# Patient Record
Sex: Male | Born: 1975 | Race: Black or African American | Hispanic: No | Smoking: Never smoker
Health system: Southern US, Community
[De-identification: ages and names within clinical notes are randomized; demographics above are authoritative.]

## PROBLEM LIST (undated history)

## (undated) ENCOUNTER — Emergency Department (HOSPITAL_COMMUNITY): Admission: EM | Payer: No Typology Code available for payment source | Source: Home / Self Care

## (undated) ENCOUNTER — Emergency Department (HOSPITAL_COMMUNITY): Discharge status: Absent without leave | Payer: No Typology Code available for payment source

## (undated) DIAGNOSIS — F329 Major depressive disorder, single episode, unspecified: Secondary | ICD-10-CM

## (undated) DIAGNOSIS — F32A Depression, unspecified: Secondary | ICD-10-CM

## (undated) DIAGNOSIS — N419 Inflammatory disease of prostate, unspecified: Secondary | ICD-10-CM

## (undated) HISTORY — DX: Inflammatory disease of prostate, unspecified: N41.9

## (undated) HISTORY — PX: APPENDECTOMY: SHX54

## (undated) HISTORY — DX: Depression, unspecified: F32.A

## (undated) HISTORY — DX: Major depressive disorder, single episode, unspecified: F32.9

---

## 2001-10-30 ENCOUNTER — Emergency Department (HOSPITAL_COMMUNITY): Admission: EM | Admit: 2001-10-30 | Discharge: 2001-10-30 | Payer: Self-pay | Admitting: *Deleted

## 2002-08-08 ENCOUNTER — Emergency Department (HOSPITAL_COMMUNITY): Admission: EM | Admit: 2002-08-08 | Discharge: 2002-08-08 | Payer: Self-pay | Admitting: Internal Medicine

## 2002-12-25 ENCOUNTER — Ambulatory Visit (HOSPITAL_COMMUNITY): Admission: RE | Admit: 2002-12-25 | Discharge: 2002-12-25 | Payer: Self-pay | Admitting: Internal Medicine

## 2002-12-25 ENCOUNTER — Encounter: Payer: Self-pay | Admitting: Internal Medicine

## 2003-06-02 ENCOUNTER — Emergency Department (HOSPITAL_COMMUNITY): Admission: EM | Admit: 2003-06-02 | Discharge: 2003-06-02 | Payer: Self-pay | Admitting: Emergency Medicine

## 2003-08-01 ENCOUNTER — Emergency Department (HOSPITAL_COMMUNITY): Admission: EM | Admit: 2003-08-01 | Discharge: 2003-08-01 | Payer: Self-pay | Admitting: Emergency Medicine

## 2004-06-09 ENCOUNTER — Emergency Department (HOSPITAL_COMMUNITY): Admission: EM | Admit: 2004-06-09 | Discharge: 2004-06-09 | Payer: Self-pay | Admitting: Emergency Medicine

## 2004-12-15 ENCOUNTER — Emergency Department (HOSPITAL_COMMUNITY): Admission: EM | Admit: 2004-12-15 | Discharge: 2004-12-15 | Payer: Self-pay | Admitting: Family Medicine

## 2006-03-20 ENCOUNTER — Emergency Department (HOSPITAL_COMMUNITY): Admission: EM | Admit: 2006-03-20 | Discharge: 2006-03-20 | Payer: Self-pay | Admitting: Emergency Medicine

## 2008-07-02 ENCOUNTER — Emergency Department (HOSPITAL_COMMUNITY): Admission: EM | Admit: 2008-07-02 | Discharge: 2008-07-03 | Payer: Self-pay | Admitting: Emergency Medicine

## 2008-10-19 ENCOUNTER — Emergency Department (HOSPITAL_COMMUNITY): Admission: EM | Admit: 2008-10-19 | Discharge: 2008-10-19 | Payer: Self-pay | Admitting: Emergency Medicine

## 2009-10-23 ENCOUNTER — Encounter: Admission: RE | Admit: 2009-10-23 | Discharge: 2009-10-23 | Payer: Self-pay | Admitting: Chiropractic Medicine

## 2010-01-19 ENCOUNTER — Encounter (INDEPENDENT_AMBULATORY_CARE_PROVIDER_SITE_OTHER): Payer: Self-pay | Admitting: General Surgery

## 2010-01-19 ENCOUNTER — Inpatient Hospital Stay (HOSPITAL_COMMUNITY): Admission: EM | Admit: 2010-01-19 | Discharge: 2010-01-21 | Payer: Self-pay | Admitting: Emergency Medicine

## 2010-07-31 ENCOUNTER — Encounter (HOSPITAL_COMMUNITY): Admission: RE | Admit: 2010-07-31 | Discharge: 2010-08-21 | Payer: Self-pay | Admitting: Chiropractic Medicine

## 2010-08-26 ENCOUNTER — Encounter (HOSPITAL_COMMUNITY)
Admission: RE | Admit: 2010-08-26 | Discharge: 2010-09-25 | Payer: Self-pay | Source: Home / Self Care | Admitting: Chiropractic Medicine

## 2011-02-10 LAB — COMPREHENSIVE METABOLIC PANEL
ALT: 28 U/L (ref 0–53)
AST: 33 U/L (ref 0–37)
Albumin: 4.3 g/dL (ref 3.5–5.2)
Alkaline Phosphatase: 54 U/L (ref 39–117)
BUN: 8 mg/dL (ref 6–23)
CO2: 25 mEq/L (ref 19–32)
Calcium: 9.5 mg/dL (ref 8.4–10.5)
Chloride: 104 mEq/L (ref 96–112)
Creatinine, Ser: 1.2 mg/dL (ref 0.4–1.5)
GFR calc Af Amer: >60 mL/min (ref >60)
GFR calc non Af Amer: >60 mL/min (ref >60)
Glucose, Bld: 96 mg/dL (ref 70–99)
Potassium: 4.3 mEq/L (ref 3.5–5.1)
Sodium: 138 mEq/L (ref 135–145)
Total Bilirubin: 0.9 mg/dL (ref 0.3–1.2)
Total Protein: 7.7 g/dL (ref 6.0–8.3)

## 2011-02-10 LAB — CBC
HCT: 52.7 % — ABNORMAL HIGH (ref 39.0–52.0)
Hemoglobin: 16.6 g/dL (ref 13.0–17.0)
MCHC: 31.5 g/dL (ref 30.0–36.0)
MCV: 80.2 fL (ref 78.0–100.0)
Platelets: 274 10*3/uL (ref 150–400)
RBC: 6.57 MIL/uL — ABNORMAL HIGH (ref 4.22–5.81)
RDW: 12.4 % (ref 11.5–15.5)
WBC: 10.6 10*3/uL — ABNORMAL HIGH (ref 4.0–10.5)

## 2011-02-10 LAB — DIFFERENTIAL
Basophils Relative: 1 % (ref 0–1)
Eosinophils Absolute: 0.1 10*3/uL (ref 0.0–0.7)
Eosinophils Relative: 1 % (ref 0–5)
Lymphs Abs: 1.5 10*3/uL (ref 0.7–4.0)
Monocytes Relative: 10 % (ref 3–12)

## 2011-02-10 LAB — URINALYSIS, ROUTINE W REFLEX MICROSCOPIC
Bilirubin Urine: NEGATIVE
Glucose, UA: NEGATIVE mg/dL
Ketones, ur: NEGATIVE mg/dL
Protein, ur: NEGATIVE mg/dL
pH: 6.5 (ref 5.0–8.0)

## 2011-02-10 LAB — LIPASE, BLOOD: Lipase: 22 U/L (ref 11–59)

## 2011-02-14 LAB — CBC
HCT: 41.2 % (ref 39.0–52.0)
MCHC: 33 g/dL (ref 30.0–36.0)
MCV: 87.5 fL (ref 78.0–100.0)
Platelets: 231 10*3/uL (ref 150–400)
RDW: 12.5 % (ref 11.5–15.5)

## 2011-02-14 LAB — BASIC METABOLIC PANEL
BUN: 5 mg/dL — ABNORMAL LOW (ref 6–23)
Chloride: 107 mEq/L (ref 96–112)
Glucose, Bld: 94 mg/dL (ref 70–99)
Potassium: 4.2 mEq/L (ref 3.5–5.1)

## 2011-06-30 ENCOUNTER — Emergency Department (HOSPITAL_COMMUNITY)
Admission: EM | Admit: 2011-06-30 | Discharge: 2011-06-30 | Disposition: A | Discharge status: Home / Self Care | Payer: Self-pay | Attending: Emergency Medicine | Admitting: Emergency Medicine

## 2011-06-30 ENCOUNTER — Emergency Department (HOSPITAL_COMMUNITY): Payer: Self-pay

## 2011-06-30 DIAGNOSIS — M546 Pain in thoracic spine: Secondary | ICD-10-CM | POA: Insufficient documentation

## 2011-06-30 DIAGNOSIS — R079 Chest pain, unspecified: Secondary | ICD-10-CM | POA: Insufficient documentation

## 2011-06-30 DIAGNOSIS — M25519 Pain in unspecified shoulder: Secondary | ICD-10-CM | POA: Insufficient documentation

## 2011-06-30 LAB — DIFFERENTIAL
Basophils Absolute: 0 10*3/uL (ref 0.0–0.1)
Basophils Relative: 1 % (ref 0–1)
Eosinophils Absolute: 0.1 10*3/uL (ref 0.0–0.7)
Eosinophils Relative: 2 % (ref 0–5)
Lymphs Abs: 2.5 10*3/uL (ref 0.7–4.0)

## 2011-06-30 LAB — BASIC METABOLIC PANEL
Calcium: 9.5 mg/dL (ref 8.4–10.5)
GFR calc non Af Amer: 46 mL/min — ABNORMAL LOW (ref >60)
Glucose, Bld: 88 mg/dL (ref 70–99)
Sodium: 139 mEq/L (ref 135–145)

## 2011-06-30 LAB — CBC
MCV: 80 fL (ref 78.0–100.0)
Platelets: 265 10*3/uL (ref 150–400)
RDW: 12.7 % (ref 11.5–15.5)
WBC: 6 10*3/uL (ref 4.0–10.5)

## 2011-08-20 LAB — URINALYSIS, ROUTINE W REFLEX MICROSCOPIC
Bilirubin Urine: NEGATIVE
Hgb urine dipstick: NEGATIVE
Protein, ur: NEGATIVE
Urobilinogen, UA: 0.2

## 2011-09-03 ENCOUNTER — Other Ambulatory Visit: Payer: Self-pay | Admitting: Chiropractic Medicine

## 2011-09-03 DIAGNOSIS — M25512 Pain in left shoulder: Secondary | ICD-10-CM

## 2011-09-07 ENCOUNTER — Other Ambulatory Visit: Payer: Self-pay

## 2012-03-02 ENCOUNTER — Encounter: Payer: Self-pay | Admitting: Orthopedic Surgery

## 2012-03-02 ENCOUNTER — Ambulatory Visit (INDEPENDENT_AMBULATORY_CARE_PROVIDER_SITE_OTHER): Payer: Self-pay | Admitting: Orthopedic Surgery

## 2012-03-02 VITALS — Ht 68.0 in | Wt 165.0 lb

## 2012-03-02 DIAGNOSIS — M779 Enthesopathy, unspecified: Secondary | ICD-10-CM

## 2012-03-02 DIAGNOSIS — S93409A Sprain of unspecified ligament of unspecified ankle, initial encounter: Secondary | ICD-10-CM

## 2012-03-02 MED ORDER — PREDNISONE 5 MG PO KIT: 5.0000 mg | PACK | ORAL | Status: DC

## 2012-03-02 NOTE — Patient Instructions (Addendum)
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

## 2012-03-02 NOTE — Progress Notes (Signed)
Patient ID: Steven Lloyd, male   DOB: 1976/06/24, 36 y.o.   MRN: [id number] Chief Complaint  Patient presents with  . Ankle Injury    Left Ankle injury.     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 Before he could continue fighting.  He complains of no ankle pain. At this time is unable to train without any difficulty.  He is complaining of some discomfort in his back and in his LEFT knee and would like some anti-inflammatories, trauma, and ibuprofen for that.  On examination, he is well-developed nourished, male, who remained hygiene or intact.  Vital signs are stable.  His ankle is completely stable. He performed a hop test without difficulty. He had no tenderness or swelling. No muscle weakness. His neurovascular exam was intact.  Impression ankle sprain, resolved.  Prednisone Dosepak for inflammation.  He is completely cleared for professional fight

## 2012-11-27 ENCOUNTER — Ambulatory Visit: Payer: Self-pay

## 2012-11-27 ENCOUNTER — Encounter: Payer: Self-pay | Admitting: Orthopedic Surgery

## 2012-11-27 ENCOUNTER — Ambulatory Visit (INDEPENDENT_AMBULATORY_CARE_PROVIDER_SITE_OTHER): Payer: Self-pay | Admitting: Orthopedic Surgery

## 2012-11-27 VITALS — BP 102/70 | Ht 68.0 in | Wt 169.8 lb

## 2012-11-27 DIAGNOSIS — M7550 Bursitis of unspecified shoulder: Secondary | ICD-10-CM | POA: Insufficient documentation

## 2012-11-27 DIAGNOSIS — M719 Bursopathy, unspecified: Secondary | ICD-10-CM

## 2012-11-27 DIAGNOSIS — M25519 Pain in unspecified shoulder: Secondary | ICD-10-CM

## 2012-11-27 DIAGNOSIS — M67919 Unspecified disorder of synovium and tendon, unspecified shoulder: Secondary | ICD-10-CM

## 2012-11-27 NOTE — Progress Notes (Signed)
Patient ID: Steven Lloyd, male   DOB: November 11, 1976, 37 y.o.   MRN: [id number] Chief Complaint  Patient presents with  . Shoulder Pain     bilateral shoulder pain started 3 weeks ago knot on right shoulder    Patient presents with recurrent bilateral shoulder pain  He is a [employer] examiner  He complains of bilateral shoulder pain when he loses form while boxing  He has mild painful for elevation he has a prominence over his right distal clavicle  Is tenderness over the distal clavicle at the a.c. joint on the right none on the left is bilateral impingement syndrome with strong intact rotator cuff  Recommend injection bilateral  Subacromial Shoulder Injection Procedure Note  Pre-operative Diagnosis: left RC Syndrome  Post-operative Diagnosis: same  Indications: pain   Anesthesia: ethyl chloride   Procedure Details   Verbal consent was obtained for the procedure. The shoulder was prepped withalcohol and the skin was anesthetized. A 20 gauge needle was advanced into the subacromial space through posterior approach without difficulty  The space was then injected with 3 ml 1% lidocaine and 1 ml of depomedrol. The injection site was cleansed with isopropyl alcohol and a dressing was applied.  Complications:  None; patient tolerated the procedure well.  Shoulder Injection Procedure Note   Pre-operative Diagnosis: right  RC Syndrome  Post-operative Diagnosis: same  Indications: pain   Anesthesia: ethyl chloride   Procedure Details   Verbal consent was obtained for the procedure. The shoulder was prepped withalcohol and the skin was anesthetized. A 20 gauge needle was advanced into the subacromial space through posterior approach without difficulty  The space was then injected with 3 ml 1% lidocaine and 1 ml of depomedrol. The injection site was cleansed with isopropyl alcohol and a dressing was applied.  Complications:  None; patient tolerated the procedure well.

## 2012-11-27 NOTE — Patient Instructions (Addendum)
You have received a steroid shot. 15% of patients experience increased pain at the injection site with in the next 24 hours. This is best treated with ice and tylenol extra strength 2 tabs every 8 hours. If you are still having pain please call the office.   Rotator Cuff Tendonitis   The rotator cuff is the collection of all the muscles and tendons (the supraspinatus, infraspinatus, subscapularis, and teres minor muscles and their tendons) that help your shoulder stay in place. This unit holds the head of the upper arm bone (humerus) in the cup (fossa) of the shoulder blade (scapula). Basically, it connects the arm to the shoulder. Tendinitis is a swelling and irritation of the tissue, called cord like structures (tendons) that connect muscle to bone. It usually is caused by overusing the joint involved. When the tissue surrounding a tendon (the synovium) becomes inflamed, it is called tenosynovitis. This also is often the result of overuse in people whose jobs require repetitive (over and over again) types of motion. HOME CARE INSTRUCTIONS    Use a sling or splint for as long as directed by your caregiver until the pain decreases.   Apply ice to the injury for 15 to 20 minutes, 3 to 4 times per day. Put the ice in a plastic bag and place a towel between the bag of ice and your skin.   Try to avoid use other than gentle range of motion while your shoulder is painful. Use and exercise only as directed by your caregiver. Stop exercises or range of motion if pain or discomfort increases, unless directed otherwise by your caregiver.   Only take over-the-counter or prescription medicines for pain, discomfort, or fever as directed by your caregiver.   If you were give a shoulder sling and straps (immobilizer), do not remove it except as directed, or until you see a caregiver for a follow-up examination. If you need to remove it, move your arm as little as possible or as directed.   You may want to sleep on  several pillows at night to lessen swelling and pain.  SEEK IMMEDIATE MEDICAL CARE IF:    Pain in your shoulder increases or new pain develops in your arm, hand, or fingers and is not relieved with medications.   You develop new, unexplained symptoms, especially increased numbness in the hands or loss of strength, or you develop any worsening of the problems which brought you in for care.   Your arm, hand, or fingers are numb or tingling.   Your arm, hand, or fingers are swollen, painful, or turn white or blue.  Document Released: 01/29/2004 Document Revised: 01/31/2012 Document Reviewed: 09/05/2008 [facility] Patient Information 2013 [place], [place].   Impingement Syndrome, Rotator Cuff, Bursitis with Rehab Impingement syndrome is a condition that involves inflammation of the tendons of the rotator cuff and the subacromial bursa, that causes pain in the shoulder. The rotator cuff consists of four tendons and muscles that control much of the shoulder and upper arm function. The subacromial bursa is a fluid filled sac that helps reduce friction between the rotator cuff and one of the bones of the shoulder (acromion). Impingement syndrome is usually an overuse injury that causes swelling of the bursa (bursitis), swelling of the tendon (tendonitis), and/or a tear of the tendon (strain). Strains are classified into three categories. Grade 1 strains cause pain, but the tendon is not lengthened. Grade 2 strains include a lengthened ligament, due to the ligament being stretched or partially ruptured. With  grade 2 strains there is still function, although the function may be decreased. Grade 3 strains include a complete tear of the tendon or muscle, and function is usually impaired. SYMPTOMS    Pain around the shoulder, often at the outer portion of the upper arm.   Pain that gets worse with shoulder function, especially when reaching overhead or lifting.   Sometimes, aching when not using the arm.    Pain that wakes you up at night.   Sometimes, tenderness, swelling, warmth, or redness over the affected area.   Loss of strength.   Limited motion of the shoulder, especially reaching behind the back (to the back pocket or to unhook bra) or across your body.   Crackling sound (crepitation) when moving the arm.   Biceps tendon pain and inflammation (in the front of the shoulder). Worse when bending the elbow or lifting.  CAUSES   Impingement syndrome is often an overuse injury, in which chronic (repetitive) motions cause the tendons or bursa to become inflamed. A strain occurs when a force is paced on the tendon or muscle that is greater than it can withstand. Common mechanisms of injury include: Stress from sudden increase in duration, frequency, or intensity of training.  Direct hit (trauma) to the shoulder.   Aging, erosion of the tendon with normal use.   Bony bump on shoulder (acromial spur).  RISK INCREASES WITH:  Contact sports (football, wrestling, boxing).   Throwing sports (baseball, tennis, volleyball).   Weightlifting and bodybuilding.   Heavy labor.   Previous injury to the rotator cuff, including impingement.   Poor shoulder strength and flexibility.   Failure to warm up properly before activity.   Inadequate protective equipment.   Old age.   Bony bump on shoulder (acromial spur).  PREVENTION    Warm up and stretch properly before activity.   Allow for adequate recovery between workouts.   Maintain physical fitness:   Strength, flexibility, and endurance.   Cardiovascular fitness.   Learn and use proper exercise technique.  PROGNOSIS   If treated properly, impingement syndrome usually goes away within 6 weeks. Sometimes surgery is required.   RELATED COMPLICATIONS    Longer healing time if not properly treated, or if not given enough time to heal.   Recurring symptoms, that result in a chronic condition.   Shoulder stiffness, frozen  shoulder, or loss of motion.   Rotator cuff tendon tear.   Recurring symptoms, especially if activity is resumed too soon, with overuse, with a direct blow, or when using poor technique.  TREATMENT   Treatment first involves the use of ice and medicine, to reduce pain and inflammation. The use of strengthening and stretching exercises may help reduce pain with activity. These exercises may be performed at home or with a therapist. If non-surgical treatment is unsuccessful after more than 6 months, surgery may be advised. After surgery and rehabilitation, activity is usually possible in 3 months.   MEDICATION  If pain medicine is needed, nonsteroidal anti-inflammatory medicines (aspirin and ibuprofen), or other minor pain relievers (acetaminophen), are often advised.   Do not take pain medicine for 7 days before surgery.   Prescription pain relievers may be given, if your caregiver thinks they are needed. Use only as directed and only as much as you need.   Corticosteroid injections may be given by your caregiver. These injections should be reserved for the most serious cases, because they may only be given a certain number of times.  HEAT  AND COLD  Cold treatment (icing) should be applied for 10 to 15 minutes every 2 to 3 hours for inflammation and pain, and immediately after activity that aggravates your symptoms. Use ice packs or an ice massage.   Heat treatment may be used before performing stretching and strengthening activities prescribed by your caregiver, physical therapist, or athletic trainer. Use a heat pack or a warm water soak.  SEEK MEDICAL CARE IF:    Symptoms get worse or do not improve in 4 to 6 weeks, despite treatment.   New, unexplained symptoms develop. (Drugs used in treatment may produce side effects.)  EXERCISES   RANGE OF MOTION (ROM) AND STRETCHING EXERCISES - Impingement Syndrome (Rotator Cuff  Tendinitis, Bursitis) These exercises may help you when beginning to  rehabilitate your injury. Your symptoms may go away with or without further involvement from your physician, physical therapist or athletic trainer. While completing these exercises, remember:    Restoring tissue flexibility helps normal motion to return to the joints. This allows healthier, less painful movement and activity.   An effective stretch should be held for at least 30 seconds.   A stretch should never be painful. You should only feel a gentle lengthening or release in the stretched tissue.  STRETCH  Flexion, Standing  Stand with good posture. With an underhand grip on your right / left hand, and an overhand grip on the opposite hand, grasp a broomstick or cane so that your hands are a little more than shoulder width apart.   Keeping your right / left elbow straight and shoulder muscles relaxed, push the stick with your opposite hand, to raise your right / left arm in front of your body and then overhead. Raise your arm until you feel a stretch in your right / left shoulder, but before you have increased shoulder pain.   Try to avoid shrugging your right / left shoulder as your arm rises, by keeping your shoulder blade tucked down and toward your mid-back spine. Hold for __________ seconds.   Slowly return to the starting position.  Repeat __________ times. Complete this exercise __________ times per day. STRETCH  Abduction, Supine  Lie on your back. With an underhand grip on your right / left hand and an overhand grip on the opposite hand, grasp a broomstick or cane so that your hands are a little more than shoulder width apart.   Keeping your right / left elbow straight and your shoulder muscles relaxed, push the stick with your opposite hand, to raise your right / left arm out to the side of your body and then overhead. Raise your arm until you feel a stretch in your right / left shoulder, but before you have increased shoulder pain.   Try to avoid shrugging your right / left  shoulder as your arm rises, by keeping your shoulder blade tucked down and toward your mid-back spine. Hold for __________ seconds.   Slowly return to the starting position.  Repeat __________ times. Complete this exercise __________ times per day. ROM  Flexion, Active-Assisted  Lie on your back. You may bend your knees for comfort.   Grasp a broomstick or cane so your hands are about shoulder width apart. Your right / left hand should grip the end of the stick, so that your hand is positioned "thumbs-up," as if you were about to shake hands.   Using your healthy arm to lead, raise your right / left arm overhead, until you feel a gentle stretch in  your shoulder. Hold for __________ seconds.   Use the stick to assist in returning your right / left arm to its starting position.  Repeat __________ times. Complete this exercise __________ times per day.   ROM - Internal Rotation, Supine   Lie on your back on a firm surface. Place your right / left elbow about 60 degrees away from your side. Elevate your elbow with a folded towel, so that the elbow and shoulder are the same height.   Using a broomstick or cane and your strong arm, pull your right / left hand toward your body until you feel a gentle stretch, but no increase in your shoulder pain. Keep your shoulder and elbow in place throughout the exercise.   Hold for __________ seconds. Slowly return to the starting position.  Repeat __________ times. Complete this exercise __________ times per day. STRETCH - Internal Rotation  Place your right / left hand behind your back, palm up.   Throw a towel or belt over your opposite shoulder. Grasp the towel with your right / left hand.   While keeping an upright posture, gently pull up on the towel, until you feel a stretch in the front of your right / left shoulder.   Avoid shrugging your right / left shoulder as your arm rises, by keeping your shoulder blade tucked down and toward your mid-back  spine.   Hold for __________ seconds. Release the stretch, by lowering your healthy hand.  Repeat __________ times. Complete this exercise __________ times per day. ROM - Internal Rotation   Using an underhand grip, grasp a stick behind your back with both hands.   While standing upright with good posture, slide the stick up your back until you feel a mild stretch in the front of your shoulder.   Hold for __________ seconds. Slowly return to your starting position.  Repeat __________ times. Complete this exercise __________ times per day.   STRETCH  Posterior Shoulder Capsule   Stand or sit with good posture. Grasp your right / left elbow and draw it across your chest, keeping it at the same height as your shoulder.   Pull your elbow, so your upper arm comes in closer to your chest. Pull until you feel a gentle stretch in the back of your shoulder.   Hold for __________ seconds.  Repeat __________ times. Complete this exercise __________ times per day. STRENGTHENING EXERCISES - Impingement Syndrome (Rotator Cuff Tendinitis, Bursitis) These exercises may help you when beginning to rehabilitate your injury. They may resolve your symptoms with or without further involvement from your physician, physical therapist or athletic trainer. While completing these exercises, remember:  Muscles can gain both the endurance and the strength needed for everyday activities through controlled exercises.   Complete these exercises as instructed by your physician, physical therapist or athletic trainer. Increase the resistance and repetitions only as guided.   You may experience muscle soreness or fatigue, but the pain or discomfort you are trying to eliminate should never worsen during these exercises. If this pain does get worse, stop and make sure you are following the directions exactly. If the pain is still present after adjustments, discontinue the exercise until you can discuss the trouble with your  clinician.   During your recovery, avoid activity or exercises which involve actions that place your injured hand or elbow above your head or behind your back or head. These positions stress the tissues which you are trying to heal.  STRENGTH - Scapular Depression  and Adduction   With good posture, sit on a firm chair. Support your arms in front of you, with pillows, arm rests, or on a table top. Have your elbows in line with the sides of your body.   Gently draw your shoulder blades down and toward your mid-back spine. Gradually increase the tension, without tensing the muscles along the top of your shoulders and the back of your neck.   Hold for __________ seconds. Slowly release the tension and relax your muscles completely before starting the next repetition.   After you have practiced this exercise, remove the arm support and complete the exercise in standing as well as sitting position.  Repeat __________ times. Complete this exercise __________ times per day.   STRENGTH - Shoulder Abductors, Isometric  With good posture, stand or sit about 4-6 inches from a wall, with your right / left side facing the wall.   Bend your right / left elbow. Gently press your right / left elbow into the wall. Increase the pressure gradually, until you are pressing as hard as you can, without shrugging your shoulder or increasing any shoulder discomfort.   Hold for __________ seconds.   Release the tension slowly. Relax your shoulder muscles completely before you begin the next repetition.  Repeat __________ times. Complete this exercise __________ times per day.   STRENGTH - External Rotators, Isometric  Keep your right / left elbow at your side and bend it 90 degrees.   Step into a door frame so that the outside of your right / left wrist can press against the door frame without your upper arm leaving your side.   Gently press your right / left wrist into the door frame, as if you were trying to  swing the back of your hand away from your stomach. Gradually increase the tension, until you are pressing as hard as you can, without shrugging your shoulder or increasing any shoulder discomfort.   Hold for __________ seconds.   Release the tension slowly. Relax your shoulder muscles completely before you begin the next repetition.  Repeat __________ times. Complete this exercise __________ times per day.   STRENGTH - Supraspinatus   Stand or sit with good posture. Grasp a __________ weight, or an exercise band or tubing, so that your hand is "thumbs-up," like you are shaking hands.   Slowly lift your right / left arm in a "V" away from your thigh, diagonally into the space between your side and straight ahead. Lift your hand to shoulder height or as far as you can, without increasing any shoulder pain. At first, many people do not lift their hands above shoulder height.   Avoid shrugging your right / left shoulder as your arm rises, by keeping your shoulder blade tucked down and toward your mid-back spine.   Hold for __________ seconds. Control the descent of your hand, as you slowly return to your starting position.  Repeat __________ times. Complete this exercise __________ times per day.   STRENGTH - External Rotators  Secure a rubber exercise band or tubing to a fixed object (table, pole) so that it is at the same height as your right / left elbow when you are standing or sitting on a firm surface.   Stand or sit so that the secured exercise band is at your uninjured side.   Bend your right / left elbow 90 degrees. Place a folded towel or small pillow under your right / left arm, so that your elbow is  a few inches away from your side.   Keeping the tension on the exercise band, pull it away from your body, as if pivoting on your elbow. Be sure to keep your body steady, so that the movement is coming only from your rotating shoulder.   Hold for __________ seconds. Release the tension  in a controlled manner, as you return to the starting position.  Repeat __________ times. Complete this exercise __________ times per day.   STRENGTH - Internal Rotators   Secure a rubber exercise band or tubing to a fixed object (table, pole) so that it is at the same height as your right / left elbow when you are standing or sitting on a firm surface.   Stand or sit so that the secured exercise band is at your right / left side.   Bend your elbow 90 degrees. Place a folded towel or small pillow under your right / left arm so that your elbow is a few inches away from your side.   Keeping the tension on the exercise band, pull it across your body, toward your stomach. Be sure to keep your body steady, so that the movement is coming only from your rotating shoulder.   Hold for __________ seconds. Release the tension in a controlled manner, as you return to the starting position.  Repeat __________ times. Complete this exercise __________ times per day.   STRENGTH - Scapular Protractors, Standing   Stand arms length away from a wall. Place your hands on the wall, keeping your elbows straight.   Begin by dropping your shoulder blades down and toward your mid-back spine.   To strengthen your protractors, keep your shoulder blades down, but slide them forward on your rib cage. It will feel as if you are lifting the back of your rib cage away from the wall. This is a subtle motion and can be challenging to complete. Ask your caregiver for further instruction, if you are not sure you are doing the exercise correctly.   Hold for __________ seconds. Slowly return to the starting position, resting the muscles completely before starting the next repetition.  Repeat __________ times. Complete this exercise __________ times per day. STRENGTH - Scapular Protractors, Supine  Lie on your back on a firm surface. Extend your right / left arm straight into the air while holding a __________ weight in your  hand.   Keeping your head and back in place, lift your shoulder off the floor.   Hold for __________ seconds. Slowly return to the starting position, and allow your muscles to relax completely before starting the next repetition.  Repeat __________ times. Complete this exercise __________ times per day. STRENGTH - Scapular Protractors, Quadruped  Get onto your hands and knees, with your shoulders directly over your hands (or as close as you can be, comfortably).   Keeping your elbows locked, lift the back of your rib cage up into your shoulder blades, so your mid-back rounds out. Keep your neck muscles relaxed.   Hold this position for __________ seconds. Slowly return to the starting position and allow your muscles to relax completely before starting the next repetition.  Repeat __________ times. Complete this exercise __________ times per day.   STRENGTH - Scapular Retractors  Secure a rubber exercise band or tubing to a fixed object (table, pole), so that it is at the height of your shoulders when you are either standing, or sitting on a firm armless chair.   With a palm down grip,  grasp an end of the band in each hand. Straighten your elbows and lift your hands straight in front of you, at shoulder height. Step back, away from the secured end of the band, until it becomes tense.   Squeezing your shoulder blades together, draw your elbows back toward your sides, as you bend them. Keep your upper arms lifted away from your body throughout the exercise.   Hold for __________ seconds. Slowly ease the tension on the band, as you reverse the directions and return to the starting position.  Repeat __________ times. Complete this exercise __________ times per day. STRENGTH - Shoulder Extensors   Secure a rubber exercise band or tubing to a fixed object (table, pole) so that it is at the height of your shoulders when you are either standing, or sitting on a firm armless chair.   With a  thumbs-up grip, grasp an end of the band in each hand. Straighten your elbows and lift your hands straight in front of you, at shoulder height. Step back, away from the secured end of the band, until it becomes tense.   Squeezing your shoulder blades together, pull your hands down to the sides of your thighs. Do not allow your hands to go behind you.   Hold for __________ seconds. Slowly ease the tension on the band, as you reverse the directions and return to the starting position.  Repeat __________ times. Complete this exercise __________ times per day.   STRENGTH - Scapular Retractors and External Rotators   Secure a rubber exercise band or tubing to a fixed object (table, pole) so that it is at the height as your shoulders, when you are either standing, or sitting on a firm armless chair.   With a palm down grip, grasp an end of the band in each hand. Bend your elbows 90 degrees and lift your elbows to shoulder height, at your sides. Step back, away from the secured end of the band, until it becomes tense.   Squeezing your shoulder blades together, rotate your shoulders so that your upper arms and elbows remain stationary, but your fists travel upward to head height.   Hold for __________ seconds. Slowly ease the tension on the band, as you reverse the directions and return to the starting position.  Repeat __________ times. Complete this exercise __________ times per day.   STRENGTH - Scapular Retractors and External Rotators, Rowing   Secure a rubber exercise band or tubing to a fixed object (table, pole) so that it is at the height of your shoulders, when you are either standing, or sitting on a firm armless chair.   With a palm down grip, grasp an end of the band in each hand. Straighten your elbows and lift your hands straight in front of you, at shoulder height. Step back, away from the secured end of the band, until it becomes tense.   Step 1: Squeeze your shoulder blades together.  Bending your elbows, draw your hands to your chest, as if you are rowing a boat. At the end of this motion, your hands and elbow should be at shoulder height and your elbows should be out to your sides.   Step 2: Rotate your shoulders, to raise your hands above your head. Your forearms should be vertical and your upper arms should be horizontal.   Hold for __________ seconds. Slowly ease the tension on the band, as you reverse the directions and return to the starting position.  Repeat __________ times. Complete  this exercise __________ times per day.   STRENGTH  Scapular Depressors  Find a sturdy chair without wheels, such as a dining room chair.   Keeping your feet on the floor, and your hands on the chair arms, lift your bottom up from the seat, and lock your elbows.   Keeping your elbows straight, allow gravity to pull your body weight down. Your shoulders will rise toward your ears.   Raise your body against gravity by drawing your shoulder blades down your back, shortening the distance between your shoulders and ears. Although your feet should always maintain contact with the floor, your feet should progressively support less body weight, as you get stronger.   Hold for __________ seconds. In a controlled and slow manner, lower your body weight to begin the next repetition.  Repeat __________ times. Complete this exercise __________ times per day.   Document Released: 11/08/2005 Document Revised: 01/31/2012 Document Reviewed: 02/20/2009 [facility] Patient Information 2013 [place], [place].

## 2013-03-07 ENCOUNTER — Encounter (HOSPITAL_COMMUNITY): Payer: Self-pay | Admitting: Emergency Medicine

## 2013-03-07 ENCOUNTER — Emergency Department (HOSPITAL_COMMUNITY)
Admission: EM | Admit: 2013-03-07 | Discharge: 2013-03-08 | Disposition: A | Discharge status: Home / Self Care | Payer: Self-pay | Attending: Emergency Medicine | Admitting: Emergency Medicine

## 2013-03-07 ENCOUNTER — Emergency Department (HOSPITAL_COMMUNITY): Payer: Self-pay

## 2013-03-07 DIAGNOSIS — S20219A Contusion of unspecified front wall of thorax, initial encounter: Secondary | ICD-10-CM | POA: Insufficient documentation

## 2013-03-07 DIAGNOSIS — S20211A Contusion of right front wall of thorax, initial encounter: Secondary | ICD-10-CM

## 2013-03-07 DIAGNOSIS — W219XXA Striking against or struck by unspecified sports equipment, initial encounter: Secondary | ICD-10-CM | POA: Insufficient documentation

## 2013-03-07 DIAGNOSIS — Y92838 Other recreation area as the place of occurrence of the external cause: Secondary | ICD-10-CM | POA: Insufficient documentation

## 2013-03-07 DIAGNOSIS — Y9371 Activity, boxing: Secondary | ICD-10-CM | POA: Insufficient documentation

## 2013-03-07 DIAGNOSIS — Y9239 Other specified sports and athletic area as the place of occurrence of the external cause: Secondary | ICD-10-CM | POA: Insufficient documentation

## 2013-03-07 MED ORDER — IBUPROFEN 600 MG PO TABS: 600.0000 mg | ORAL_TABLET | Freq: Four times a day (QID) | ORAL | Status: DC | PRN

## 2013-03-07 MED ORDER — HYDROCODONE-ACETAMINOPHEN 5-325 MG PO TABS
1.0000 | ORAL_TABLET | Freq: Once | ORAL | Status: AC
  Administered 2013-03-07: 1 via ORAL
  Filled 2013-03-07: qty 1

## 2013-03-07 MED ORDER — DIAZEPAM 2 MG PO TABS
2.0000 mg | ORAL_TABLET | Freq: Once | ORAL | Status: AC
  Administered 2013-03-07: 2 mg via ORAL
  Filled 2013-03-07: qty 1

## 2013-03-07 MED ORDER — IBUPROFEN 400 MG PO TABS
600.0000 mg | ORAL_TABLET | Freq: Once | ORAL | Status: AC
  Administered 2013-03-07: 600 mg via ORAL
  Filled 2013-03-07: qty 1

## 2013-03-07 MED ORDER — METHOCARBAMOL 500 MG PO TABS: 500.0000 mg | ORAL_TABLET | Freq: Two times a day (BID) | ORAL | Status: DC

## 2013-03-07 MED ORDER — HYDROCODONE-ACETAMINOPHEN 5-325 MG PO TABS: 1.0000 | ORAL_TABLET | Freq: Four times a day (QID) | ORAL | Status: DC | PRN
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
## 2016-09-04 ENCOUNTER — Emergency Department (HOSPITAL_COMMUNITY): Payer: Self-pay

## 2016-09-04 ENCOUNTER — Emergency Department (HOSPITAL_COMMUNITY)
Admission: EM | Admit: 2016-09-04 | Discharge: 2016-09-04 | Disposition: A | Discharge status: Home / Self Care | Payer: Self-pay | Attending: Emergency Medicine | Admitting: Emergency Medicine

## 2016-09-04 DIAGNOSIS — S0081XA Abrasion of other part of head, initial encounter: Secondary | ICD-10-CM

## 2016-09-04 DIAGNOSIS — S0101XA Laceration without foreign body of scalp, initial encounter: Secondary | ICD-10-CM | POA: Insufficient documentation

## 2016-09-04 DIAGNOSIS — S0083XA Contusion of other part of head, initial encounter: Secondary | ICD-10-CM

## 2016-09-04 DIAGNOSIS — Y9389 Activity, other specified: Secondary | ICD-10-CM | POA: Insufficient documentation

## 2016-09-04 DIAGNOSIS — Y999 Unspecified external cause status: Secondary | ICD-10-CM | POA: Insufficient documentation

## 2016-09-04 DIAGNOSIS — Y9289 Other specified places as the place of occurrence of the external cause: Secondary | ICD-10-CM | POA: Insufficient documentation

## 2016-09-04 MED ORDER — BUPIVACAINE HCL (PF) 0.5 % IJ SOLN
20.0000 mL | Freq: Once | INTRAMUSCULAR | Status: AC
  Administered 2016-09-04: 20 mL
  Filled 2016-09-04: qty 30

## 2016-09-04 NOTE — ED Triage Notes (Signed)
Pt has a laceration to the back of his head, dressing in place per ems
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
## 2016-09-04 NOTE — Discharge Instructions (Signed)
Clean your plans once a day with soap and water. You can use triple antibiotic ointment on the wounds to help prevent infection. The staples need to be removed in one week. You can return to the ED if you do not have a primary care doctor to do that. Return to the emergency department for any problems listed on the head injury sheet. Also be rechecked if any of your wounds look like they're getting infected such as increased swelling, pain, drainage of pus.

## 2017-01-06 ENCOUNTER — Encounter (HOSPITAL_COMMUNITY): Payer: Self-pay

## 2017-01-06 ENCOUNTER — Emergency Department (HOSPITAL_COMMUNITY)
Admission: EM | Admit: 2017-01-06 | Discharge: 2017-01-06 | Disposition: A | Discharge status: Home / Self Care | Payer: Self-pay | Attending: Emergency Medicine | Admitting: Emergency Medicine

## 2017-01-06 DIAGNOSIS — Z79899 Other long term (current) drug therapy: Secondary | ICD-10-CM | POA: Insufficient documentation

## 2017-01-06 DIAGNOSIS — T7840XA Allergy, unspecified, initial encounter: Secondary | ICD-10-CM | POA: Insufficient documentation

## 2017-01-06 MED ORDER — DIPHENHYDRAMINE HCL 50 MG/ML IJ SOLN
25.0000 mg | Freq: Once | INTRAMUSCULAR | Status: AC
  Administered 2017-01-06: 25 mg via INTRAVENOUS
  Filled 2017-01-06: qty 1

## 2017-01-06 MED ORDER — METHYLPREDNISOLONE SODIUM SUCC 125 MG IJ SOLR
125.0000 mg | Freq: Once | INTRAMUSCULAR | Status: AC
  Administered 2017-01-06: 125 mg via INTRAVENOUS
  Filled 2017-01-06: qty 2

## 2017-01-06 MED ORDER — PREDNISONE 10 MG PO TABS: 20.0000 mg | ORAL_TABLET | Freq: Every day | ORAL | 0 refills | Status: DC

## 2017-01-06 MED ORDER — FAMOTIDINE IN NACL 20-0.9 MG/50ML-% IV SOLN
20.0000 mg | Freq: Once | INTRAVENOUS | Status: AC
  Administered 2017-01-06: 20 mg via INTRAVENOUS
  Filled 2017-01-06: qty 50

## 2017-01-06 MED ORDER — SODIUM CHLORIDE 0.9 % IV BOLUS (SEPSIS)
1000.0000 mL | Freq: Once | INTRAVENOUS | Status: AC
  Administered 2017-01-06: 1000 mL via INTRAVENOUS

## 2017-01-06 NOTE — ED Notes (Addendum)
Pt states took some old PCN 2 days ago & also TheraFlu. Pt reports itching & skin burning. No swelling noted to tongue or throat. Pt says he has been taking Benadryl last dose was an hour ago. Slight rash noted to legs & abdomen.

## 2017-01-06 NOTE — ED Provider Notes (Signed)
[facility] Provider Note   CSN: [id number] Arrival date & time: 01/06/17  2033 By signing my name below, I, Levon Hedger, attest that this documentation has been prepared under the direction and in the presence of Donnetta Hutching, MD . Electronically Signed: Levon Hedger, Scribe. 01/06/2017. 9:18 PM.   History   Chief Complaint Chief Complaint  Patient presents with  . Allergic Reaction   HPI Steven Lloyd is a 41 y.o. male who presents to the Emergency Department complaining of sudden onset, itching, erythematous rash which began two days ago. Pt states "I was coming down with the flu", so he took some old penicillin pills two days ago and yesterday. He has also taken Theraflu x3 days. No penicillin taken today. He has taken benadryl with no significant relief of rash. No new soaps, lotions, detergents, foods, animals, or plant contact indicated. No hx of allergies. He denies any SOB. Pt has no other complaints or symptoms at this time.    The history is provided by the patient. No language interpreter was used.    History reviewed. No pertinent past medical history.  Patient Active Problem List   Diagnosis Date Noted  . Shoulder bursitis 11/27/2012  . Inflammation around joint 03/02/2012  . Ankle sprain 03/02/2012    Past Surgical History:  Procedure Laterality Date  . APPENDECTOMY      Home Medications    Prior to Admission medications   Medication Sig Start Date End Date Taking? Authorizing Provider  Ascorbic Acid (VITAMIN C) 1000 MG tablet Take 1,000 mg by mouth daily.   Yes Historical Provider, MD  B Complex Vitamins (B COMPLEX PO) Take 1 capsule by mouth daily.   Yes Historical Provider, MD  Bath Products (AVEENO CALMING BODY [facility] EX) Apply 1 application topically daily.   Yes Historical Provider, MD  Echinacea 500 MG CAPS Take 1 capsule by mouth daily.   Yes Historical Provider, MD  Emollient (GOLD BOND ULTIMATE HEALING) CREA Apply 1 application topically  daily.   Yes Historical Provider, MD  zinc gluconate 50 MG tablet Take 50 mg by mouth daily.   Yes Historical Provider, MD  predniSONE (DELTASONE) 10 MG tablet Take 2 tablets (20 mg total) by mouth daily. 01/06/17   Donnetta Hutching, MD    Family History No family history on file.  Social History Social History  Substance Use Topics  . Smoking status: Never Smoker  . Smokeless tobacco: Never Used  . Alcohol use Yes     Allergies   Patient has no known allergies.   Review of Systems Review of Systems 10 systems reviewed and all are negative for acute change except as noted in the HPI.  Physical Exam Updated Vital Signs BP 116/78 (BP Location: Left Arm)   Pulse 87   Temp 98.4 F (36.9 C) (Oral)   Resp 18   Ht 5\' 8"  (1.727 m)   Wt 160 lb (72.6 kg)   SpO2 95%   BMI 24.33 kg/m   Physical Exam  Constitutional: He is oriented to person, place, and time.  No respiratory distress  HENT:  Head: Normocephalic and atraumatic.  Eyes: Conjunctivae are normal.  Neck: Neck supple.  Cardiovascular: Normal rate and regular rhythm.   Pulmonary/Chest: Effort normal and breath sounds normal.  Abdominal: Soft. Bowel sounds are normal.  Musculoskeletal: Normal range of motion.  Neurological: He is alert and oriented to person, place, and time.  Skin:  Diffuse macular erythematous rash  Psychiatric: He has a normal  mood and affect. His behavior is normal.  Nursing note and vitals reviewed.  ED Treatments / Results  DIAGNOSTIC STUDIES:  Oxygen Saturation is 98% on RA, normal by my interpretation.    COORDINATION OF CARE:  9:14 PM Will treat with IV steroids, benadryl and steroids. Discussed treatment plan with pt at bedside and pt agreed to plan.  Labs (all labs ordered are listed, but only abnormal results are displayed) Labs Reviewed - No data to display  EKG  EKG Interpretation None       Radiology No results found.  Procedures Procedures (including critical care  time)  Medications Ordered in ED Medications  sodium chloride 0.9 % bolus 1,000 mL (0 mLs Intravenous Stopped 01/06/17 2222)  methylPREDNISolone sodium succinate (SOLU-MEDROL) 125 mg/2 mL injection 125 mg (125 mg Intravenous Given 01/06/17 2132)  famotidine (PEPCID) IVPB 20 mg premix (0 mg Intravenous Stopped 01/06/17 2202)  diphenhydrAMINE (BENADRYL) injection 25 mg (25 mg Intravenous Given 01/06/17 2129)     Initial Impression / Assessment and Plan / ED Course  I have reviewed the triage vital signs and the nursing notes.  Pertinent labs & imaging results that were available during my care of the patient were reviewed by me and considered in my medical decision making (see chart for details).     History and physical most consistent with an allergic reaction to penicillin. He had a good response to IV Solu-Medrol, IV Benadryl, IV Pepcid.  Discharge medication prednisone.  Final Clinical Impressions(s) / ED Diagnoses   Final diagnoses:  Allergic reaction, initial encounter    New Prescriptions New Prescriptions   PREDNISONE (DELTASONE) 10 MG TABLET    Take 2 tablets (20 mg total) by mouth daily.  I personally performed the services described in this documentation, which was scribed in my presence. The recorded information has been reviewed and is accurate.     Donnetta HutchingBrian Dacoda Spallone, MD 01/06/17 [phone]

## 2017-01-06 NOTE — ED Notes (Signed)
Pt alert & oriented x4, stable gait. Patient given discharge instructions, paperwork & prescription(s). Patient  instructed to stop at the registration desk to finish any additional paperwork. Patient verbalized understanding. Pt left department w/ no further questions. 

## 2017-01-06 NOTE — Discharge Instructions (Signed)
You most likely have an allergy to penicillin. Can take Benadryl or Claritin. Prescription for prednisone for 3 days.

## 2017-01-06 NOTE — ED Triage Notes (Addendum)
I am having reaction to PCN.  I started taking it 2 days ago.  It was breaking me out, having pain in my left lower back and my skin is bothering me.  I have taking Benadryl for the past two days also.  I have been taking Theraflu also.  I was seen at the health department 2 weeks ago and they told me that my left kidney function was low.  Patient does not have any visible swelling in his throat at this time.  Patient states that it is hard to swallow at times.  Denies shortness of breath.

## 2017-08-06 ENCOUNTER — Emergency Department (HOSPITAL_COMMUNITY)
Admission: EM | Admit: 2017-08-06 | Discharge: 2017-08-06 | Disposition: A | Discharge status: Home / Self Care | Payer: Self-pay | Attending: Emergency Medicine | Admitting: Emergency Medicine

## 2017-08-06 ENCOUNTER — Emergency Department (HOSPITAL_COMMUNITY): Payer: Self-pay

## 2017-08-06 ENCOUNTER — Encounter (HOSPITAL_COMMUNITY): Payer: Self-pay | Admitting: *Deleted

## 2017-08-06 DIAGNOSIS — R1032 Left lower quadrant pain: Secondary | ICD-10-CM | POA: Insufficient documentation

## 2017-08-06 DIAGNOSIS — R109 Unspecified abdominal pain: Secondary | ICD-10-CM

## 2017-08-06 DIAGNOSIS — R300 Dysuria: Secondary | ICD-10-CM | POA: Insufficient documentation

## 2017-08-06 LAB — URINALYSIS, ROUTINE W REFLEX MICROSCOPIC
BILIRUBIN URINE: NEGATIVE
Glucose, UA: NEGATIVE mg/dL
Hgb urine dipstick: NEGATIVE
KETONES UR: NEGATIVE mg/dL
Leukocytes, UA: NEGATIVE
Nitrite: NEGATIVE
PROTEIN: NEGATIVE mg/dL
Specific Gravity, Urine: 1.021 (ref 1.005–1.030)
pH: 5 (ref 5.0–8.0)

## 2017-08-06 LAB — CBC WITH DIFFERENTIAL/PLATELET
BASOS ABS: 0 10*3/uL (ref 0.0–0.1)
Basophils Relative: 1 %
EOS ABS: 0.1 10*3/uL (ref 0.0–0.7)
Eosinophils Relative: 2 %
HCT: 47.3 % (ref 39.0–52.0)
Hemoglobin: 15.3 g/dL (ref 13.0–17.0)
Lymphocytes Relative: 49 %
Lymphs Abs: 1.8 10*3/uL (ref 0.7–4.0)
MCH: 27.5 pg (ref 26.0–34.0)
MCHC: 32.3 g/dL (ref 30.0–36.0)
MCV: 85.1 fL (ref 78.0–100.0)
MONO ABS: 0.4 10*3/uL (ref 0.1–1.0)
Monocytes Relative: 12 %
Neutro Abs: 1.3 10*3/uL — ABNORMAL LOW (ref 1.7–7.7)
Neutrophils Relative %: 36 %
PLATELETS: 233 10*3/uL (ref 150–400)
RBC: 5.56 MIL/uL (ref 4.22–5.81)
RDW: 13.4 % (ref 11.5–15.5)
WBC: 3.6 10*3/uL — AB (ref 4.0–10.5)

## 2017-08-06 LAB — BASIC METABOLIC PANEL
Anion gap: 7 (ref 5–15)
BUN: 18 mg/dL (ref 6–20)
CO2: 29 mmol/L (ref 22–32)
CREATININE: 1.67 mg/dL — AB (ref 0.61–1.24)
Calcium: 9.2 mg/dL (ref 8.9–10.3)
Chloride: 104 mmol/L (ref 101–111)
GFR calc Af Amer: 58 mL/min — ABNORMAL LOW (ref >60)
GFR, EST NON AFRICAN AMERICAN: 50 mL/min — AB (ref >60)
GLUCOSE: 97 mg/dL (ref 65–99)
Potassium: 4.4 mmol/L (ref 3.5–5.1)
SODIUM: 140 mmol/L (ref 135–145)

## 2017-08-06 LAB — CK: CK TOTAL: 290 U/L (ref 49–397)

## 2017-08-06 MED ORDER — IOPAMIDOL (ISOVUE-300) INJECTION 61%
100.0000 mL | Freq: Once | INTRAVENOUS | Status: AC | PRN
  Administered 2017-08-06: 100 mL via INTRAVENOUS

## 2017-08-06 MED ORDER — CEFTRIAXONE SODIUM 250 MG IJ SOLR
250.0000 mg | Freq: Once | INTRAMUSCULAR | Status: AC
  Administered 2017-08-06: 250 mg via INTRAMUSCULAR
  Filled 2017-08-06: qty 250

## 2017-08-06 MED ORDER — AZITHROMYCIN 250 MG PO TABS
1000.0000 mg | ORAL_TABLET | Freq: Once | ORAL | Status: AC
  Administered 2017-08-06: 1000 mg via ORAL
  Filled 2017-08-06: qty 4

## 2017-08-06 MED ORDER — FENTANYL CITRATE (PF) 100 MCG/2ML IJ SOLN
50.0000 ug | Freq: Once | INTRAMUSCULAR | Status: AC
  Administered 2017-08-06: 50 ug via INTRAVENOUS
  Filled 2017-08-06: qty 2

## 2017-08-06 MED ORDER — LIDOCAINE HCL (PF) 1 % IJ SOLN
INTRAMUSCULAR | Status: AC
  Filled 2017-08-06: qty 2

## 2017-08-06 MED ORDER — IBUPROFEN 800 MG PO TABS: 800.0000 mg | ORAL_TABLET | Freq: Three times a day (TID) | ORAL | 0 refills | Status: DC | PRN

## 2017-08-06 MED ORDER — POLYETHYLENE GLYCOL 3350 17 G PO PACK: 17.0000 g | PACK | Freq: Every day | ORAL | 0 refills | Status: DC

## 2017-08-06 MED ORDER — LIDOCAINE HCL (PF) 1 % IJ SOLN
1.2000 mL | Freq: Once | INTRAMUSCULAR | Status: AC
  Administered 2017-08-06: 1.2 mL
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
## 2017-08-06 NOTE — ED Notes (Signed)
Waiting med time. 
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
## 2017-08-06 NOTE — Discharge Instructions (Signed)

## 2017-08-08 LAB — GC/CHLAMYDIA PROBE AMP (~~LOC~~) NOT AT ARMC
CHLAMYDIA, DNA PROBE: NEGATIVE
Neisseria Gonorrhea: NEGATIVE

## 2017-08-22 ENCOUNTER — Emergency Department (HOSPITAL_COMMUNITY): Payer: Self-pay

## 2017-08-22 ENCOUNTER — Emergency Department (HOSPITAL_COMMUNITY)
Admission: EM | Admit: 2017-08-22 | Discharge: 2017-08-22 | Disposition: A | Discharge status: Home / Self Care | Payer: Self-pay | Attending: Emergency Medicine | Admitting: Emergency Medicine

## 2017-08-22 ENCOUNTER — Encounter (HOSPITAL_COMMUNITY): Payer: Self-pay | Admitting: *Deleted

## 2017-08-22 DIAGNOSIS — R079 Chest pain, unspecified: Secondary | ICD-10-CM | POA: Insufficient documentation

## 2017-08-22 LAB — I-STAT TROPONIN, ED: TROPONIN I, POC: 0.01 ng/mL (ref 0.00–0.08)

## 2017-08-22 LAB — I-STAT CHEM 8, ED
BUN: 12 mg/dL (ref 6–20)
Calcium, Ion: 1.2 mmol/L (ref 1.15–1.40)
Chloride: 102 mmol/L (ref 101–111)
Creatinine, Ser: 1.7 mg/dL — ABNORMAL HIGH (ref 0.61–1.24)
GLUCOSE: 91 mg/dL (ref 65–99)
HCT: 46 % (ref 39.0–52.0)
Hemoglobin: 15.6 g/dL (ref 13.0–17.0)
POTASSIUM: 3.9 mmol/L (ref 3.5–5.1)
Sodium: 140 mmol/L (ref 135–145)
TCO2: 28 mmol/L (ref 22–32)

## 2017-08-22 MED ORDER — KETOROLAC TROMETHAMINE 60 MG/2ML IM SOLN
60.0000 mg | Freq: Once | INTRAMUSCULAR | Status: AC
  Administered 2017-08-22: 60 mg via INTRAMUSCULAR
  Filled 2017-08-22: qty 2

## 2017-08-22 MED ORDER — IBUPROFEN 400 MG PO TABS: 200.0000 mg | ORAL_TABLET | Freq: Two times a day (BID) | ORAL | 0 refills | Status: AC

## 2017-08-22 NOTE — ED Provider Notes (Signed)
Emergency Department Provider Note   I have reviewed the triage vital signs and the nursing notes.   HISTORY  Chief Complaint  Shortness of Breath   HPI Steven Lloyd is a 41 y.o. male without significant past medical history presents to the emergency room today with right-sided peristernal chest pain. Patient states that he had what sounds like upper respiratory infection that seems to have cleared up over the last couple weeks but then a couple days ago he was boxing which is a normal thing for him and they're that time he had the acute onset of sharp chest pain. He states he is not really short of breath, just more pain with breathing.no rashes that area did not specifically get hit in that area. Pain does not really any better or worse with any position suggest calms when he takes a deep breath at this point. It has been progressively improving since this started. No cough then over sputum production. No lower extremity swelling. No recent travels.   History reviewed. No pertinent past medical history.  Patient Active Problem List   Diagnosis Date Noted  . Shoulder bursitis 11/27/2012  . Inflammation around joint 03/02/2012  . Ankle sprain 03/02/2012    Past Surgical History:  Procedure Laterality Date  . APPENDECTOMY      Current Outpatient Rx  . Order #: [id number] Class: Historical Med  . Order #: [id number] Class: Historical Med  . Order #: [id number] Class: Historical Med  . Order #: [id number] Class: Historical Med  . Order #: [id number] Class: Historical Med  . Order #: [id number] Class: Print  . Order #: [id number] Class: Print  . Order #: [id number] Class: Historical Med  . Order #: [id number] Class: Print    Allergies Patient has no known allergies.  No family history on file.  Social History Social History  Substance Use Topics  . Smoking status: Never Smoker  . Smokeless tobacco: Never Used  . Alcohol use Yes    Review of Systems  All other systems  negative except as documented in the HPI. All pertinent positives and negatives as reviewed in the HPI. ____________________________________________   PHYSICAL EXAM:  VITAL SIGNS: ED Triage Vitals  Enc Vitals Group     BP 08/22/17 0555 124/76     Pulse Rate 08/22/17 0555 61     Resp 08/22/17 0555 16     Temp 08/22/17 0555 98.6 F (37 C)     Temp Source 08/22/17 0555 Oral     SpO2 08/22/17 0555 98 %     Weight 08/22/17 0554 154 lb (69.9 kg)     Height 08/22/17 0554  (1.727 m)     Head Circumference --      Peak Flow --      Pain Score 08/22/17 0555 8     Pain Loc --      Pain Edu? --      Excl. in GC? --     Constitutional: Alert and oriented. Well appearing and in no acute distress. Eyes: Conjunctivae are normal. PERRL. EOMI. Head: Atraumatic. Nose: No congestion/rhinnorhea. Mouth/Throat: Mucous membranes are moist.  Oropharynx non-erythematous. Neck: No stridor.  No meningeal signs.   Cardiovascular: Normal rate, regular rhythm. Good peripheral circulation. Grossly normal heart sounds. Mild tenderness to his right mid sternal area. Does not completely re-create his sympto Respiratory: Normal respiratory effort.  No retractions. Lungs CTAB. Gastrointestinal: Soft and nontender. No distention.  Musculoskeletal: No lower extremity tenderness nor edema. No gross deformities of extremities. Neurologic:  Normal speech  and language. No gross focal neurologic deficits are appreciated.  Skin:  Skin is warm, dry and intact. No rash noted.   ____________________________________________   LABS (all labs ordered are listed, but only abnormal results are displayed)  Labs Reviewed  I-STAT CHEM 8, ED - Abnormal; Notable for the following:       Result Value   Creatinine, Ser 1.70 (*)    All other components within normal limits  I-STAT TROPONIN, ED   ____________________________________________  EKG   EKG Interpretation  Date/Time:  Monday August 22 2017 07:23:09  EDT Ventricular Rate:  59 PR Interval:    QRS Duration: 90 QT Interval:  419 QTC Calculation: 415 R Axis:   -4 Text Interpretation:  Sinus rhythm No significant change since last tracing Confirmed by Marily Memos [phone]) on 08/22/2017 8:16:38 AM       ____________________________________________  RADIOLOGY  Dg Chest 2 View  Result Date: 08/22/2017 CLINICAL DATA:  41 y/o  M; cough and shortness of breath. EXAM: CHEST  2 VIEW COMPARISON:  03/07/2013 chest radiograph FINDINGS: Stable heart size and mediastinal contours are within normal limits. Both lungs are clear. The visualized skeletal structures are unremarkable. IMPRESSION: No acute pulmonary process identified. Electronically Signed   By: Mitzi Hansen M.D.   On: 08/22/2017 06:16    ____________________________________________   PROCEDURES  Procedure(s) performed:   Procedures   ____________________________________________   INITIAL IMPRESSION / ASSESSMENT AND PLAN / ED COURSE  Pertinent labs & imaging results that were available during my care of the patient were reviewed by me and considered in my medical decision making (see chart for details).  Suspect muscle skeletal causes for his symptoms also costochondritis versus pericarditis after a viral infection. We'll treat with anti-inflammatories. Does have a history of chronic kidney disease which is abnormal for someone of his age so we'll check a troponin just to make sure this is ACS however does not fit that description at all the patient has 0 risk factors for it. If this is negative the patient can go home on anti-inflammatories at a reduced dose secondary to his kidney function.  Symptoms improved. Baseline creatinine. He is to follow-up with a primary doctor. Have given him information to make that happen. I suspect this is a inflammatory related so will send him home on ibuprofen. I recognize that his kidney function is poor so very low dose for short  duration only twice a day to help with his symptoms.   ____________________________________________  FINAL CLINICAL IMPRESSION(S) / ED DIAGNOSES  Final diagnoses:  Chest pain, unspecified type     MEDICATIONS GIVEN DURING THIS VISIT:  Medications  ketorolac (TORADOL) injection 60 mg (60 mg Intramuscular Given 08/22/17 0840)     NEW OUTPATIENT MEDICATIONS STARTED DURING THIS VISIT:  New Prescriptions   IBUPROFEN (ADVIL,MOTRIN) 400 MG TABLET    Take 0.5 tablets (200 mg total) by mouth every 12 (twelve) hours.    Note:  This document was prepared using Dragon voice recognition software and may include unintentional dictation errors.   Marily Memos, MD 08/22/17 [phone]

## 2017-08-22 NOTE — ED Notes (Signed)
Pt says he started working out again that maybe what causing the soreness in his chest.

## 2017-08-22 NOTE — ED Triage Notes (Signed)
Pt says been feeling bad for few days. SOB started yesterday. States hurts to take a deep breath, has coughed a little stuff up.

## 2017-09-02 ENCOUNTER — Ambulatory Visit: Payer: Self-pay | Admitting: Family Medicine

## 2017-09-09 ENCOUNTER — Other Ambulatory Visit (HOSPITAL_COMMUNITY)
Admission: RE | Admit: 2017-09-09 | Discharge: 2017-09-09 | Disposition: A | Discharge status: Home / Self Care | Payer: PRIVATE HEALTH INSURANCE | Source: Other Acute Inpatient Hospital | Attending: Family Medicine | Admitting: Family Medicine

## 2017-09-09 ENCOUNTER — Other Ambulatory Visit (HOSPITAL_COMMUNITY)
Admission: RE | Admit: 2017-09-09 | Discharge: 2017-09-09 | Disposition: A | Discharge status: Home / Self Care | Payer: PRIVATE HEALTH INSURANCE | Source: Ambulatory Visit | Attending: Family Medicine | Admitting: Family Medicine

## 2017-09-09 ENCOUNTER — Encounter (INDEPENDENT_AMBULATORY_CARE_PROVIDER_SITE_OTHER): Payer: Self-pay

## 2017-09-09 ENCOUNTER — Encounter: Payer: Self-pay | Admitting: Family Medicine

## 2017-09-09 ENCOUNTER — Ambulatory Visit (INDEPENDENT_AMBULATORY_CARE_PROVIDER_SITE_OTHER): Payer: PRIVATE HEALTH INSURANCE | Admitting: Family Medicine

## 2017-09-09 VITALS — BP 138/78 | HR 77 | Temp 97.9°F | Resp 16 | Ht 68.0 in | Wt 156.2 lb

## 2017-09-09 DIAGNOSIS — R309 Painful micturition, unspecified: Secondary | ICD-10-CM | POA: Diagnosis not present

## 2017-09-09 DIAGNOSIS — R7989 Other specified abnormal findings of blood chemistry: Secondary | ICD-10-CM | POA: Insufficient documentation

## 2017-09-09 DIAGNOSIS — D72810 Lymphocytopenia: Secondary | ICD-10-CM | POA: Diagnosis not present

## 2017-09-09 DIAGNOSIS — R369 Urethral discharge, unspecified: Secondary | ICD-10-CM | POA: Insufficient documentation

## 2017-09-09 DIAGNOSIS — Z23 Encounter for immunization: Secondary | ICD-10-CM | POA: Diagnosis not present

## 2017-09-09 DIAGNOSIS — Z113 Encounter for screening for infections with a predominantly sexual mode of transmission: Secondary | ICD-10-CM | POA: Diagnosis not present

## 2017-09-09 LAB — URINALYSIS, ROUTINE W REFLEX MICROSCOPIC
Bilirubin Urine: NEGATIVE
GLUCOSE, UA: NEGATIVE mg/dL
Hgb urine dipstick: NEGATIVE
Ketones, ur: NEGATIVE mg/dL
LEUKOCYTES UA: NEGATIVE
Nitrite: NEGATIVE
PH: 6 (ref 5.0–8.0)
Protein, ur: NEGATIVE mg/dL
SPECIFIC GRAVITY, URINE: 1.019 (ref 1.005–1.030)

## 2017-09-09 LAB — POCT URINALYSIS DIPSTICK
BILIRUBIN UA: NEGATIVE
GLUCOSE UA: NEGATIVE
KETONES UA: NEGATIVE
Leukocytes, UA: NEGATIVE
Nitrite, UA: NEGATIVE
PROTEIN UA: NEGATIVE
RBC UA: NEGATIVE
SPEC GRAV UA: 1.02 (ref 1.010–1.025)
UROBILINOGEN UA: 0.2 U/dL
pH, UA: 6.5 (ref 5.0–8.0)

## 2017-09-09 MED ORDER — CIPROFLOXACIN HCL 500 MG PO TABS: 500.0000 mg | ORAL_TABLET | Freq: Two times a day (BID) | ORAL | 0 refills | Status: DC
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
## 2017-09-11 LAB — URINE CULTURE
Culture: NO GROWTH
MICRO NUMBER: 81171984
Result:: NO GROWTH
SPECIMEN QUALITY: ADEQUATE

## 2017-09-12 LAB — URINALYSIS, ROUTINE W REFLEX MICROSCOPIC
BILIRUBIN URINE: NEGATIVE
GLUCOSE, UA: NEGATIVE
Hgb urine dipstick: NEGATIVE
KETONES UR: NEGATIVE
Leukocytes, UA: NEGATIVE
Nitrite: NEGATIVE
PH: 6.5 (ref 5.0–8.0)
Protein, ur: NEGATIVE
SPECIFIC GRAVITY, URINE: 1.021 (ref 1.001–1.03)

## 2017-09-12 LAB — COMPLETE METABOLIC PANEL WITH GFR
AG Ratio: 1.6 (calc) (ref 1.0–2.5)
ALT: 15 U/L (ref 9–46)
AST: 22 U/L (ref 10–40)
Albumin: 4.5 g/dL (ref 3.6–5.1)
Alkaline phosphatase (APISO): 76 U/L (ref 40–115)
BUN/Creatinine Ratio: 7 (calc) (ref 6–22)
BUN: 11 mg/dL (ref 7–25)
CALCIUM: 9.9 mg/dL (ref 8.6–10.3)
CO2: 30 mmol/L (ref 20–32)
CREATININE: 1.49 mg/dL — AB (ref 0.60–1.35)
Chloride: 102 mmol/L (ref 98–110)
GFR, EST NON AFRICAN AMERICAN: 58 mL/min/{1.73_m2} — AB (ref >=60)
GFR, Est African American: 68 mL/min/{1.73_m2} (ref >=60)
GLOBULIN: 2.9 g/dL (ref 1.9–3.7)
Glucose, Bld: 85 mg/dL (ref 65–99)
Potassium: 4.5 mmol/L (ref 3.5–5.3)
Sodium: 142 mmol/L (ref 135–146)
Total Bilirubin: 0.7 mg/dL (ref 0.2–1.2)
Total Protein: 7.4 g/dL (ref 6.1–8.1)

## 2017-09-12 LAB — HEPATITIS PANEL, ACUTE
HEP A IGM: NONREACTIVE
HEP B C IGM: NONREACTIVE
HEP C AB: NONREACTIVE
Hepatitis B Surface Ag: NONREACTIVE
SIGNAL TO CUT-OFF: 0.03 (ref <1.00)

## 2017-09-12 LAB — CBC WITH DIFFERENTIAL/PLATELET
Basophils Absolute: 21 cells/uL (ref 0–200)
Basophils Relative: 0.5 %
EOS PCT: 2.1 %
Eosinophils Absolute: 88 cells/uL (ref 15–500)
HEMATOCRIT: 47.4 % (ref 38.5–50.0)
Hemoglobin: 16.5 g/dL (ref 13.2–17.1)
LYMPHS ABS: 1336 {cells}/uL (ref 850–3900)
MCH: 28.3 pg (ref 27.0–33.0)
MCHC: 34.8 g/dL (ref 32.0–36.0)
MCV: 81.2 fL (ref 80.0–100.0)
MPV: 9.6 fL (ref 7.5–12.5)
Monocytes Relative: 15.1 %
NEUTROS PCT: 50.5 %
Neutro Abs: 2121 cells/uL (ref 1500–7800)
PLATELETS: 292 10*3/uL (ref 140–400)
RBC: 5.84 10*6/uL — AB (ref 4.20–5.80)
RDW: 12.5 % (ref 11.0–15.0)
Total Lymphocyte: 31.8 %
WBC mixed population: 634 cells/uL (ref 200–950)
WBC: 4.2 10*3/uL (ref 3.8–10.8)

## 2017-09-12 LAB — HIV ANTIBODY (ROUTINE TESTING W REFLEX): HIV 1&2 Ab, 4th Generation: NONREACTIVE

## 2017-09-12 LAB — RPR: RPR: NONREACTIVE

## 2017-09-13 ENCOUNTER — Encounter: Payer: Self-pay | Admitting: Family Medicine

## 2017-09-13 LAB — URINE CYTOLOGY ANCILLARY ONLY
Chlamydia: NEGATIVE
NEISSERIA GONORRHEA: NEGATIVE
Trichomonas: NEGATIVE

## 2017-09-14 LAB — URINE CYTOLOGY ANCILLARY ONLY: Candida vaginitis: NEGATIVE

## 2017-10-07 ENCOUNTER — Encounter: Payer: Self-pay | Admitting: Family Medicine

## 2017-10-07 ENCOUNTER — Ambulatory Visit (INDEPENDENT_AMBULATORY_CARE_PROVIDER_SITE_OTHER): Payer: PRIVATE HEALTH INSURANCE | Admitting: Family Medicine

## 2017-10-07 ENCOUNTER — Other Ambulatory Visit: Payer: Self-pay

## 2017-10-07 VITALS — BP 126/74 | HR 66 | Temp 98.8°F | Resp 16 | Ht 68.0 in | Wt 161.8 lb

## 2017-10-07 DIAGNOSIS — Z23 Encounter for immunization: Secondary | ICD-10-CM

## 2017-10-07 DIAGNOSIS — R7989 Other specified abnormal findings of blood chemistry: Secondary | ICD-10-CM

## 2017-10-07 DIAGNOSIS — Z113 Encounter for screening for infections with a predominantly sexual mode of transmission: Secondary | ICD-10-CM | POA: Diagnosis not present

## 2017-10-07 DIAGNOSIS — R399 Unspecified symptoms and signs involving the genitourinary system: Secondary | ICD-10-CM | POA: Diagnosis not present
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
## 2017-10-07 NOTE — Patient Instructions (Signed)
Iron-Rich Diet Iron is a mineral that helps your body to produce hemoglobin. Hemoglobin is a protein in your red blood cells that carries oxygen to your body's tissues. Eating too little iron may cause you to feel weak and tired, and it can increase your risk for infection. Eating enough iron is necessary for your body's metabolism, muscle function, and nervous system. Iron is naturally found in many foods. It can also be added to foods or fortified in foods. There are two types of dietary iron:  Heme iron. Heme iron is absorbed by the body more easily than nonheme iron. Heme iron is found in meat, poultry, and fish.  Nonheme iron. Nonheme iron is found in dietary supplements, iron-fortified grains, beans, and vegetables.  You may need to follow an iron-rich diet if:  You have been diagnosed with iron deficiency or iron-deficiency anemia.  You have a condition that prevents you from absorbing dietary iron, such as: ? Infection in your intestines. ? Celiac disease. This involves long-lasting (chronic) inflammation of your intestines.  You do not eat enough iron.  You eat a diet that is high in foods that impair iron absorption.  You have lost a lot of blood.  You have heavy bleeding during your menstrual cycle.  You are pregnant.  What is my plan? Your health care provider may help you to determine how much iron you need per day based on your condition. Generally, when a person consumes sufficient amounts of iron in the diet, the following iron needs are met:  Men. ? 14-18 years old: 11 mg per day. ? 19-50 years old: 8 mg per day.  Women. ? 14-18 years old: 15 mg per day. ? 19-50 years old: 18 mg per day. ? Over 50 years old: 8 mg per day. ? Pregnant women: 27 mg per day. ? Breastfeeding women: 9 mg per day.  What do I need to know about an iron-rich diet?  Eat fresh fruits and vegetables that are high in vitamin C along with foods that are high in iron. This will help  increase the amount of iron that your body absorbs from food, especially with foods containing nonheme iron. Foods that are high in vitamin C include oranges, peppers, tomatoes, and mango.  Take iron supplements only as directed by your health care provider. Overdose of iron can be life-threatening. If you were prescribed iron supplements, take them with orange juice or a vitamin C supplement.  Cook foods in pots and pans that are made from iron.  Eat nonheme iron-containing foods alongside foods that are high in heme iron. This helps to improve your iron absorption.  Certain foods and drinks contain compounds that impair iron absorption. Avoid eating these foods in the same meal as iron-rich foods or with iron supplements. These include: ? Coffee, black tea, and red wine. ? Milk, dairy products, and foods that are high in calcium. ? Beans, soybeans, and peas. ? Whole grains.  When eating foods that contain both nonheme iron and compounds that impair iron absorption, follow these tips to absorb iron better. ? Soak beans overnight before cooking. ? Soak whole grains overnight and drain them before using. ? Ferment flours before baking, such as using yeast in bread dough. What foods can I eat? Grains Iron-fortified breakfast cereal. Iron-fortified whole-wheat bread. Enriched rice. Sprouted grains. Vegetables Spinach. Potatoes with skin. Green peas. Broccoli. Red and green bell peppers. Fermented vegetables. Fruits Prunes. Raisins. Oranges. Strawberries. Mango. Grapefruit. Meats and Other Protein Sources   Beef liver. Oysters. Beef. Shrimp. [place]. Chicken. [place]. Sardines. Chickpeas. Nuts. Tofu. Beverages Tomato juice. Fresh orange juice. Prune juice. Hibiscus tea. Fortified instant breakfast shakes. Condiments Tahini. Fermented soy sauce. Sweets and Desserts Black-strap molasses. Other Wheat germ. The items listed above may not be a complete list of recommended foods or beverages.  Contact your dietitian for more options. What foods are not recommended? Grains Whole grains. Bran cereal. Bran flour. Oats. Vegetables Artichokes. Brussels sprouts. Kale. Fruits Blueberries. Raspberries. Strawberries. Figs. Meats and Other Protein Sources Soybeans. Products made from soy protein. Dairy Milk. Cream. Cheese. Yogurt. Cottage cheese. Beverages Coffee. Black tea. Red wine. Sweets and Desserts Cocoa. Chocolate. Ice cream. Other Basil. Oregano. Parsley. The items listed above may not be a complete list of foods and beverages to avoid. Contact your dietitian for more information. This information is not intended to replace advice given to you by your health care provider. Make sure you discuss any questions you have with your health care provider. Document Released: 06/22/2005 Document Revised: 05/28/2016 Document Reviewed: 06/05/2014 Elsevier Interactive Patient Education  [employer].

## 2017-10-10 LAB — BASIC METABOLIC PANEL
BUN / CREAT RATIO: 9 (calc) (ref 6–22)
BUN: 13 mg/dL (ref 7–25)
CHLORIDE: 105 mmol/L (ref 98–110)
CO2: 27 mmol/L (ref 20–32)
CREATININE: 1.43 mg/dL — AB (ref 0.60–1.35)
Calcium: 9.3 mg/dL (ref 8.6–10.3)
Glucose, Bld: 84 mg/dL (ref 65–99)
Potassium: 4.2 mmol/L (ref 3.5–5.3)
SODIUM: 140 mmol/L (ref 135–146)

## 2017-10-10 LAB — RPR: RPR Ser Ql: NONREACTIVE

## 2017-11-18 ENCOUNTER — Encounter: Payer: Self-pay | Admitting: Family Medicine

## 2017-11-18 ENCOUNTER — Other Ambulatory Visit: Payer: Self-pay

## 2017-11-18 ENCOUNTER — Ambulatory Visit (INDEPENDENT_AMBULATORY_CARE_PROVIDER_SITE_OTHER): Payer: PRIVATE HEALTH INSURANCE | Admitting: Family Medicine

## 2017-11-18 VITALS — BP 110/76 | HR 62 | Temp 98.7°F | Resp 16 | Ht 68.0 in | Wt 162.8 lb

## 2017-11-18 DIAGNOSIS — R1013 Epigastric pain: Secondary | ICD-10-CM

## 2017-11-18 DIAGNOSIS — R7989 Other specified abnormal findings of blood chemistry: Secondary | ICD-10-CM

## 2017-11-18 DIAGNOSIS — Z23 Encounter for immunization: Secondary | ICD-10-CM

## 2017-11-18 DIAGNOSIS — R399 Unspecified symptoms and signs involving the genitourinary system: Secondary | ICD-10-CM
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
## 2017-12-21 ENCOUNTER — Ambulatory Visit (INDEPENDENT_AMBULATORY_CARE_PROVIDER_SITE_OTHER): Payer: No Typology Code available for payment source | Admitting: Urology

## 2017-12-21 DIAGNOSIS — R109 Unspecified abdominal pain: Secondary | ICD-10-CM

## 2018-02-01 ENCOUNTER — Ambulatory Visit (INDEPENDENT_AMBULATORY_CARE_PROVIDER_SITE_OTHER): Payer: No Typology Code available for payment source | Admitting: Urology

## 2018-02-01 DIAGNOSIS — R109 Unspecified abdominal pain: Secondary | ICD-10-CM | POA: Diagnosis not present

## 2018-02-01 DIAGNOSIS — N411 Chronic prostatitis: Secondary | ICD-10-CM | POA: Diagnosis not present

## 2018-02-11 ENCOUNTER — Other Ambulatory Visit: Payer: Self-pay

## 2018-02-11 ENCOUNTER — Emergency Department (HOSPITAL_COMMUNITY): Payer: No Typology Code available for payment source

## 2018-02-11 ENCOUNTER — Inpatient Hospital Stay (HOSPITAL_COMMUNITY): Payer: No Typology Code available for payment source | Admitting: Anesthesiology

## 2018-02-11 ENCOUNTER — Encounter (HOSPITAL_COMMUNITY)
Admission: EM | Disposition: A | Discharge status: Other Acute Inpatient Hospital | Payer: Self-pay | Source: Home / Self Care

## 2018-02-11 ENCOUNTER — Inpatient Hospital Stay (HOSPITAL_COMMUNITY)
Admission: EM | Admit: 2018-02-11 | Discharge: 2018-02-14 | DRG: 026 | Disposition: A | Discharge status: Other Acute Inpatient Hospital | Payer: No Typology Code available for payment source | Attending: General Surgery | Admitting: General Surgery

## 2018-02-11 ENCOUNTER — Encounter (HOSPITAL_COMMUNITY): Payer: Self-pay | Admitting: Emergency Medicine

## 2018-02-11 DIAGNOSIS — G4700 Insomnia, unspecified: Secondary | ICD-10-CM | POA: Diagnosis not present

## 2018-02-11 DIAGNOSIS — S065X9A Traumatic subdural hemorrhage with loss of consciousness of unspecified duration, initial encounter: Secondary | ICD-10-CM | POA: Diagnosis present

## 2018-02-11 DIAGNOSIS — R4587 Impulsiveness: Secondary | ICD-10-CM | POA: Diagnosis not present

## 2018-02-11 DIAGNOSIS — R402132 Coma scale, eyes open, to sound, at arrival to emergency department: Secondary | ICD-10-CM | POA: Diagnosis present

## 2018-02-11 DIAGNOSIS — S020XXB Fracture of vault of skull, initial encounter for open fracture: Secondary | ICD-10-CM | POA: Diagnosis present

## 2018-02-11 DIAGNOSIS — Z818 Family history of other mental and behavioral disorders: Secondary | ICD-10-CM | POA: Diagnosis not present

## 2018-02-11 DIAGNOSIS — S069X9A Unspecified intracranial injury with loss of consciousness of unspecified duration, initial encounter: Secondary | ICD-10-CM

## 2018-02-11 DIAGNOSIS — R402362 Coma scale, best motor response, obeys commands, at arrival to emergency department: Secondary | ICD-10-CM | POA: Diagnosis present

## 2018-02-11 DIAGNOSIS — W3400XA Accidental discharge from unspecified firearms or gun, initial encounter: Secondary | ICD-10-CM | POA: Diagnosis not present

## 2018-02-11 DIAGNOSIS — R4182 Altered mental status, unspecified: Secondary | ICD-10-CM | POA: Diagnosis present

## 2018-02-11 DIAGNOSIS — F332 Major depressive disorder, recurrent severe without psychotic features: Secondary | ICD-10-CM | POA: Diagnosis not present

## 2018-02-11 DIAGNOSIS — R402242 Coma scale, best verbal response, confused conversation, at arrival to emergency department: Secondary | ICD-10-CM | POA: Diagnosis present

## 2018-02-11 DIAGNOSIS — F419 Anxiety disorder, unspecified: Secondary | ICD-10-CM | POA: Diagnosis not present

## 2018-02-11 DIAGNOSIS — S066X9A Traumatic subarachnoid hemorrhage with loss of consciousness of unspecified duration, initial encounter: Secondary | ICD-10-CM | POA: Diagnosis present

## 2018-02-11 DIAGNOSIS — F322 Major depressive disorder, single episode, severe without psychotic features: Secondary | ICD-10-CM

## 2018-02-11 DIAGNOSIS — N179 Acute kidney failure, unspecified: Secondary | ICD-10-CM | POA: Diagnosis present

## 2018-02-11 DIAGNOSIS — X72XXXA Intentional self-harm by handgun discharge, initial encounter: Secondary | ICD-10-CM

## 2018-02-11 DIAGNOSIS — Y92009 Unspecified place in unspecified non-institutional (private) residence as the place of occurrence of the external cause: Secondary | ICD-10-CM

## 2018-02-11 DIAGNOSIS — S069XAA Unspecified intracranial injury with loss of consciousness status unknown, initial encounter: Secondary | ICD-10-CM | POA: Diagnosis present

## 2018-02-11 DIAGNOSIS — Z630 Problems in relationship with spouse or partner: Secondary | ICD-10-CM | POA: Diagnosis not present

## 2018-02-11 DIAGNOSIS — R45851 Suicidal ideations: Secondary | ICD-10-CM | POA: Diagnosis present

## 2018-02-11 DIAGNOSIS — F1290 Cannabis use, unspecified, uncomplicated: Secondary | ICD-10-CM | POA: Diagnosis not present

## 2018-02-11 DIAGNOSIS — T1491XA Suicide attempt, initial encounter: Secondary | ICD-10-CM | POA: Diagnosis not present

## 2018-02-11 DIAGNOSIS — F329 Major depressive disorder, single episode, unspecified: Secondary | ICD-10-CM | POA: Diagnosis present

## 2018-02-11 DIAGNOSIS — F410 Panic disorder [episodic paroxysmal anxiety] without agoraphobia: Secondary | ICD-10-CM | POA: Diagnosis not present

## 2018-02-11 DIAGNOSIS — S0193XA Puncture wound without foreign body of unspecified part of head, initial encounter: Secondary | ICD-10-CM

## 2018-02-11 DIAGNOSIS — G8929 Other chronic pain: Secondary | ICD-10-CM | POA: Diagnosis not present

## 2018-02-11 HISTORY — PX: CRANIECTOMY FOR DEPRESSED SKULL FRACTURE: SHX5788

## 2018-02-11 LAB — TYPE AND SCREEN
ABO/RH(D): B POS
Antibody Screen: NEGATIVE
UNIT DIVISION: 0
UNIT DIVISION: 0

## 2018-02-11 LAB — URINALYSIS, ROUTINE W REFLEX MICROSCOPIC
BILIRUBIN URINE: NEGATIVE
GLUCOSE, UA: NEGATIVE mg/dL
Ketones, ur: NEGATIVE mg/dL
NITRITE: NEGATIVE
Protein, ur: NEGATIVE mg/dL
SPECIFIC GRAVITY, URINE: 1.006 (ref 1.005–1.030)
pH: 5 (ref 5.0–8.0)

## 2018-02-11 LAB — I-STAT CHEM 8, ED
BUN: 10 mg/dL (ref 6–20)
CREATININE: 1.4 mg/dL — AB (ref 0.61–1.24)
Calcium, Ion: 1.08 mmol/L — ABNORMAL LOW (ref 1.15–1.40)
Chloride: 103 mmol/L (ref 101–111)
GLUCOSE: 118 mg/dL — AB (ref 65–99)
HEMATOCRIT: 43 % (ref 39.0–52.0)
HEMOGLOBIN: 14.6 g/dL (ref 13.0–17.0)
Potassium: 4.9 mmol/L (ref 3.5–5.1)
Sodium: 137 mmol/L (ref 135–145)
TCO2: 27 mmol/L (ref 22–32)

## 2018-02-11 LAB — BPAM FFP
BLOOD PRODUCT EXPIRATION DATE: 201904122359
Blood Product Expiration Date: 201904132359
ISSUE DATE / TIME: 201903230849
ISSUE DATE / TIME: 201903230849
UNIT TYPE AND RH: 600
UNIT TYPE AND RH: 6200

## 2018-02-11 LAB — COMPREHENSIVE METABOLIC PANEL
ALT: 12 U/L — ABNORMAL LOW (ref 17–63)
AST: 31 U/L (ref 15–41)
Albumin: 3.7 g/dL (ref 3.5–5.0)
Alkaline Phosphatase: 53 U/L (ref 38–126)
Anion gap: 9 (ref 5–15)
BILIRUBIN TOTAL: 1 mg/dL (ref 0.3–1.2)
BUN: 7 mg/dL (ref 6–20)
CHLORIDE: 101 mmol/L (ref 101–111)
CO2: 24 mmol/L (ref 22–32)
CREATININE: 1.41 mg/dL — AB (ref 0.61–1.24)
Calcium: 8.4 mg/dL — ABNORMAL LOW (ref 8.9–10.3)
Glucose, Bld: 115 mg/dL — ABNORMAL HIGH (ref 65–99)
POTASSIUM: 4.9 mmol/L (ref 3.5–5.1)
Sodium: 134 mmol/L — ABNORMAL LOW (ref 135–145)
TOTAL PROTEIN: 6 g/dL — AB (ref 6.5–8.1)

## 2018-02-11 LAB — CBC
HEMATOCRIT: 43 % (ref 39.0–52.0)
Hemoglobin: 14.4 g/dL (ref 13.0–17.0)
MCH: 28.1 pg (ref 26.0–34.0)
MCHC: 33.5 g/dL (ref 30.0–36.0)
MCV: 84 fL (ref 78.0–100.0)
PLATELETS: 260 10*3/uL (ref 150–400)
RBC: 5.12 MIL/uL (ref 4.22–5.81)
RDW: 12.8 % (ref 11.5–15.5)
WBC: 9.3 10*3/uL (ref 4.0–10.5)

## 2018-02-11 LAB — ETHANOL: Alcohol, Ethyl (B): <10 mg/dL (ref <10)

## 2018-02-11 LAB — PREPARE FRESH FROZEN PLASMA
UNIT DIVISION: 0
Unit division: 0

## 2018-02-11 LAB — MRSA PCR SCREENING: MRSA BY PCR: NEGATIVE

## 2018-02-11 LAB — ABO/RH: ABO/RH(D): B POS

## 2018-02-11 LAB — BPAM RBC
BLOOD PRODUCT EXPIRATION DATE: 201904062359
Blood Product Expiration Date: 201904072359
ISSUE DATE / TIME: 201903230848
ISSUE DATE / TIME: 201903230848
UNIT TYPE AND RH: 9500
Unit Type and Rh: 9500

## 2018-02-11 LAB — PROTIME-INR
INR: 1.07
Prothrombin Time: 13.8 seconds (ref 11.4–15.2)

## 2018-02-11 LAB — CDS SEROLOGY

## 2018-02-11 LAB — I-STAT CG4 LACTIC ACID, ED: Lactic Acid, Venous: 1.76 mmol/L (ref 0.5–1.9)

## 2018-02-11 SURGERY — CRANIECTOMY FOR DEPRESSED SKULL FRACTURE
Anesthesia: General | Site: Head

## 2018-02-11 MED ORDER — ACETAMINOPHEN 650 MG RE SUPP: 650.0000 mg | RECTAL | Status: DC | PRN

## 2018-02-11 MED ORDER — LEVETIRACETAM IN NACL 500 MG/100ML IV SOLN
500.0000 mg | Freq: Two times a day (BID) | INTRAVENOUS | Status: DC
  Administered 2018-02-11 – 2018-02-14 (×6): 500 mg via INTRAVENOUS
  Filled 2018-02-11 (×7): qty 100

## 2018-02-11 MED ORDER — HYDROMORPHONE HCL 1 MG/ML IJ SOLN
INTRAMUSCULAR | Status: AC
  Administered 2018-02-11: 0.5 mg via INTRAVENOUS
  Filled 2018-02-11: qty 1

## 2018-02-11 MED ORDER — POTASSIUM CHLORIDE IN NACL 20-0.9 MEQ/L-% IV SOLN
INTRAVENOUS | Status: DC
  Administered 2018-02-11 – 2018-02-12 (×3): via INTRAVENOUS
  Filled 2018-02-11 (×4): qty 1000

## 2018-02-11 MED ORDER — THROMBIN (RECOMBINANT) 20000 UNITS EX SOLR
OROMUCOSAL | Status: DC | PRN
  Administered 2018-02-11: 20 mL via TOPICAL

## 2018-02-11 MED ORDER — PANTOPRAZOLE SODIUM 40 MG IV SOLR: 40.0000 mg | Freq: Every day | INTRAVENOUS | Status: DC

## 2018-02-11 MED ORDER — ONDANSETRON HCL 4 MG/2ML IJ SOLN: 4.0000 mg | INTRAMUSCULAR | Status: DC | PRN

## 2018-02-11 MED ORDER — LIDOCAINE HCL (CARDIAC) 20 MG/ML IV SOLN
INTRAVENOUS | Status: AC
  Filled 2018-02-11: qty 5

## 2018-02-11 MED ORDER — FENTANYL CITRATE (PF) 100 MCG/2ML IJ SOLN
INTRAMUSCULAR | Status: AC
  Filled 2018-02-11: qty 2

## 2018-02-11 MED ORDER — ACETAMINOPHEN 325 MG PO TABS: 650.0000 mg | ORAL_TABLET | ORAL | Status: DC | PRN

## 2018-02-11 MED ORDER — ONDANSETRON 4 MG PO TBDP: 4.0000 mg | ORAL_TABLET | Freq: Four times a day (QID) | ORAL | Status: DC | PRN

## 2018-02-11 MED ORDER — SUCCINYLCHOLINE CHLORIDE 20 MG/ML IJ SOLN
INTRAMUSCULAR | Status: DC | PRN
  Administered 2018-02-11: 120 mg via INTRAVENOUS

## 2018-02-11 MED ORDER — LIDOCAINE-EPINEPHRINE 0.5 %-1:200000 IJ SOLN
INTRAMUSCULAR | Status: AC
  Filled 2018-02-11: qty 1

## 2018-02-11 MED ORDER — PHENYLEPHRINE HCL 10 MG/ML IJ SOLN
INTRAVENOUS | Status: DC | PRN
  Administered 2018-02-11: 10 ug/min via INTRAVENOUS

## 2018-02-11 MED ORDER — SUGAMMADEX SODIUM 200 MG/2ML IV SOLN
INTRAVENOUS | Status: AC
  Filled 2018-02-11: qty 2

## 2018-02-11 MED ORDER — PANTOPRAZOLE SODIUM 40 MG IV SOLR
40.0000 mg | Freq: Every day | INTRAVENOUS | Status: DC
  Administered 2018-02-11: 40 mg via INTRAVENOUS
  Filled 2018-02-11: qty 40

## 2018-02-11 MED ORDER — EPHEDRINE 5 MG/ML INJ
INTRAVENOUS | Status: AC
  Filled 2018-02-11: qty 10

## 2018-02-11 MED ORDER — NALOXONE HCL 0.4 MG/ML IJ SOLN: 0.0800 mg | INTRAMUSCULAR | Status: DC | PRN

## 2018-02-11 MED ORDER — FENTANYL CITRATE (PF) 100 MCG/2ML IJ SOLN
INTRAMUSCULAR | Status: AC | PRN
  Administered 2018-02-11: 50 ug via INTRAVENOUS

## 2018-02-11 MED ORDER — PHENYLEPHRINE 40 MCG/ML (10ML) SYRINGE FOR IV PUSH (FOR BLOOD PRESSURE SUPPORT)
PREFILLED_SYRINGE | INTRAVENOUS | Status: AC
  Filled 2018-02-11: qty 10

## 2018-02-11 MED ORDER — ONDANSETRON HCL 4 MG/2ML IJ SOLN
INTRAMUSCULAR | Status: AC
  Filled 2018-02-11: qty 2

## 2018-02-11 MED ORDER — LABETALOL HCL 5 MG/ML IV SOLN
10.0000 mg | INTRAVENOUS | Status: DC | PRN
  Filled 2018-02-11: qty 8

## 2018-02-11 MED ORDER — LEVETIRACETAM IN NACL 1000 MG/100ML IV SOLN
1000.0000 mg | INTRAVENOUS | Status: AC
  Administered 2018-02-11: 1000 mg via INTRAVENOUS
  Filled 2018-02-11: qty 100

## 2018-02-11 MED ORDER — CEFAZOLIN SODIUM-DEXTROSE 2-3 GM-%(50ML) IV SOLR
INTRAVENOUS | Status: DC | PRN
  Administered 2018-02-11: 2 g via INTRAVENOUS

## 2018-02-11 MED ORDER — 0.9 % SODIUM CHLORIDE (POUR BTL) OPTIME
TOPICAL | Status: DC | PRN
  Administered 2018-02-11 (×4): 1000 mL

## 2018-02-11 MED ORDER — SODIUM CHLORIDE 0.9 % IV SOLN
INTRAVENOUS | Status: DC | PRN
  Administered 2018-02-11: 11:00:00 via INTRAVENOUS

## 2018-02-11 MED ORDER — PANTOPRAZOLE SODIUM 40 MG PO TBEC
40.0000 mg | DELAYED_RELEASE_TABLET | Freq: Every day | ORAL | Status: DC
  Administered 2018-02-12 – 2018-02-14 (×3): 40 mg via ORAL
  Filled 2018-02-11 (×3): qty 1

## 2018-02-11 MED ORDER — PROPOFOL 10 MG/ML IV BOLUS
INTRAVENOUS | Status: DC | PRN
  Administered 2018-02-11: 120 mg via INTRAVENOUS

## 2018-02-11 MED ORDER — TAMSULOSIN HCL 0.4 MG PO CAPS
0.4000 mg | ORAL_CAPSULE | Freq: Every day | ORAL | Status: DC
  Administered 2018-02-11 – 2018-02-14 (×4): 0.4 mg via ORAL
  Filled 2018-02-11 (×4): qty 1

## 2018-02-11 MED ORDER — ROCURONIUM BROMIDE 100 MG/10ML IV SOLN
INTRAVENOUS | Status: DC | PRN
  Administered 2018-02-11: 50 mg via INTRAVENOUS

## 2018-02-11 MED ORDER — TETANUS-DIPHTH-ACELL PERTUSSIS 5-2.5-18.5 LF-MCG/0.5 IM SUSP
INTRAMUSCULAR | Status: AC
  Administered 2018-02-14: 0.5 mL via INTRAMUSCULAR
  Filled 2018-02-11: qty 0.5

## 2018-02-11 MED ORDER — LIDOCAINE HCL (PF) 0.5 % IJ SOLN
INTRAMUSCULAR | Status: AC
  Filled 2018-02-11: qty 50

## 2018-02-11 MED ORDER — THROMBIN 20000 UNITS EX SOLR
CUTANEOUS | Status: AC
  Filled 2018-02-11: qty 20000

## 2018-02-11 MED ORDER — FENTANYL CITRATE (PF) 100 MCG/2ML IJ SOLN
INTRAMUSCULAR | Status: DC | PRN
  Administered 2018-02-11: 150 ug via INTRAVENOUS
  Administered 2018-02-11 (×2): 50 ug via INTRAVENOUS

## 2018-02-11 MED ORDER — PROMETHAZINE HCL 25 MG PO TABS: 12.5000 mg | ORAL_TABLET | ORAL | Status: DC | PRN

## 2018-02-11 MED ORDER — FENTANYL CITRATE (PF) 100 MCG/2ML IJ SOLN: 25.0000 ug | INTRAMUSCULAR | Status: DC | PRN

## 2018-02-11 MED ORDER — MEPERIDINE HCL 50 MG/ML IJ SOLN: 6.2500 mg | INTRAMUSCULAR | Status: DC | PRN

## 2018-02-11 MED ORDER — ONDANSETRON HCL 4 MG/2ML IJ SOLN: 4.0000 mg | Freq: Four times a day (QID) | INTRAMUSCULAR | Status: DC | PRN

## 2018-02-11 MED ORDER — BACITRACIN ZINC 500 UNIT/GM EX OINT
TOPICAL_OINTMENT | CUTANEOUS | Status: AC
  Filled 2018-02-11: qty 28.35

## 2018-02-11 MED ORDER — MORPHINE SULFATE (PF) 4 MG/ML IV SOLN
1.0000 mg | INTRAVENOUS | Status: DC | PRN
  Administered 2018-02-11 (×2): 2 mg via INTRAVENOUS
  Filled 2018-02-11 (×2): qty 1

## 2018-02-11 MED ORDER — ONDANSETRON HCL 4 MG/2ML IJ SOLN: 4.0000 mg | Freq: Once | INTRAMUSCULAR | Status: DC | PRN

## 2018-02-11 MED ORDER — THROMBIN 5000 UNITS EX SOLR
CUTANEOUS | Status: AC
  Filled 2018-02-11: qty 5000

## 2018-02-11 MED ORDER — CEFAZOLIN SODIUM-DEXTROSE 2-4 GM/100ML-% IV SOLN
2.0000 g | Freq: Three times a day (TID) | INTRAVENOUS | Status: AC
  Administered 2018-02-11 (×2): 2 g via INTRAVENOUS
  Filled 2018-02-11 (×2): qty 100

## 2018-02-11 MED ORDER — POTASSIUM CHLORIDE IN NACL 20-0.9 MEQ/L-% IV SOLN
INTRAVENOUS | Status: DC
Start: 2018-02-11 — End: 2018-02-14
  Administered 2018-02-13 (×2): via INTRAVENOUS
  Filled 2018-02-11 (×2): qty 1000

## 2018-02-11 MED ORDER — ONDANSETRON HCL 4 MG PO TABS: 4.0000 mg | ORAL_TABLET | ORAL | Status: DC | PRN

## 2018-02-11 MED ORDER — MIDAZOLAM HCL 2 MG/2ML IJ SOLN
INTRAMUSCULAR | Status: AC
  Filled 2018-02-11: qty 2

## 2018-02-11 MED ORDER — HYDROMORPHONE HCL 1 MG/ML IJ SOLN
1.0000 mg | INTRAMUSCULAR | Status: DC | PRN
  Administered 2018-02-11 – 2018-02-13 (×14): 1 mg via INTRAVENOUS
  Filled 2018-02-11 (×15): qty 1

## 2018-02-11 MED ORDER — FENTANYL CITRATE (PF) 250 MCG/5ML IJ SOLN
INTRAMUSCULAR | Status: AC
  Filled 2018-02-11: qty 5

## 2018-02-11 MED ORDER — CEFAZOLIN SODIUM-DEXTROSE 2-4 GM/100ML-% IV SOLN
INTRAVENOUS | Status: AC
  Filled 2018-02-11: qty 100

## 2018-02-11 MED ORDER — SUGAMMADEX SODIUM 200 MG/2ML IV SOLN
INTRAVENOUS | Status: DC | PRN
  Administered 2018-02-11: 200 mg via INTRAVENOUS

## 2018-02-11 MED ORDER — BACITRACIN ZINC 500 UNIT/GM EX OINT
TOPICAL_OINTMENT | CUTANEOUS | Status: DC | PRN
  Administered 2018-02-11: 1 via TOPICAL

## 2018-02-11 MED ORDER — LIDOCAINE HCL (CARDIAC) 20 MG/ML IV SOLN
INTRAVENOUS | Status: DC | PRN
  Administered 2018-02-11: 100 mg via INTRAVENOUS

## 2018-02-11 MED ORDER — TETANUS-DIPHTH-ACELL PERTUSSIS 5-2.5-18.5 LF-MCG/0.5 IM SUSP
0.5000 mL | Freq: Once | INTRAMUSCULAR | Status: AC
  Administered 2018-02-14: 0.5 mL via INTRAMUSCULAR
  Filled 2018-02-11: qty 0.5

## 2018-02-11 MED ORDER — HYDROCODONE-ACETAMINOPHEN 5-325 MG PO TABS
1.0000 | ORAL_TABLET | ORAL | Status: DC | PRN
  Administered 2018-02-12: 1 via ORAL
  Filled 2018-02-11: qty 1

## 2018-02-11 MED ORDER — HYDROMORPHONE HCL 1 MG/ML IJ SOLN
0.2500 mg | INTRAMUSCULAR | Status: DC | PRN
  Administered 2018-02-11 (×2): 0.5 mg via INTRAVENOUS

## 2018-02-11 MED ORDER — PROPOFOL 10 MG/ML IV BOLUS
INTRAVENOUS | Status: AC
  Filled 2018-02-11: qty 20

## 2018-02-11 MED ORDER — ROCURONIUM BROMIDE 10 MG/ML (PF) SYRINGE
PREFILLED_SYRINGE | INTRAVENOUS | Status: AC
  Filled 2018-02-11: qty 5

## 2018-02-11 MED ORDER — ONDANSETRON HCL 4 MG/2ML IJ SOLN
INTRAMUSCULAR | Status: DC | PRN
  Administered 2018-02-11: 4 mg via INTRAVENOUS

## 2018-02-11 SURGICAL SUPPLY — 65 items
APL SKNCLS STERI-STRIP NONHPOA (GAUZE/BANDAGES/DRESSINGS)
BENZOIN TINCTURE PRP APPL 2/3 (GAUZE/BANDAGES/DRESSINGS) IMPLANT
BLADE CLIPPER SURG (BLADE) ×2 IMPLANT
BNDG GAUZE ELAST 4 BULKY (GAUZE/BANDAGES/DRESSINGS) IMPLANT
BUR ACORN 6.0 PRECISION (BURR) ×1 IMPLANT
BUR MATCHSTICK NEURO 3.0 LAGG (BURR) IMPLANT
BUR SPIRAL ROUTER 2.3 (BUR) ×2 IMPLANT
CANISTER SUCT 3000ML PPV (MISCELLANEOUS) ×2 IMPLANT
CARTRIDGE OIL MAESTRO DRILL (MISCELLANEOUS) ×1 IMPLANT
CLIP VESOCCLUDE MED 6/CT (CLIP) IMPLANT
DIFFUSER DRILL AIR PNEUMATIC (MISCELLANEOUS) ×2 IMPLANT
DRAPE NEUROLOGICAL W/INCISE (DRAPES) ×2 IMPLANT
DRAPE SURG 17X23 STRL (DRAPES) IMPLANT
DRAPE WARM FLUID 44X44 (DRAPE) ×2 IMPLANT
DRSG OPSITE POSTOP 4X8 (GAUZE/BANDAGES/DRESSINGS) ×1 IMPLANT
DURAPREP 6ML APPLICATOR 50/CS (WOUND CARE) ×1 IMPLANT
ELECT REM PT RETURN 9FT ADLT (ELECTROSURGICAL) ×2
ELECTRODE REM PT RTRN 9FT ADLT (ELECTROSURGICAL) ×1 IMPLANT
EVACUATOR 1/8 PVC DRAIN (DRAIN) IMPLANT
EVACUATOR SILICONE 100CC (DRAIN) IMPLANT
GAUZE SPONGE 4X4 12PLY STRL (GAUZE/BANDAGES/DRESSINGS) ×2 IMPLANT
GAUZE SPONGE 4X4 16PLY XRAY LF (GAUZE/BANDAGES/DRESSINGS) IMPLANT
GLOVE BIO SURGEON STRL SZ7 (GLOVE) ×2 IMPLANT
GLOVE BIOGEL PI IND STRL 7.5 (GLOVE) IMPLANT
GLOVE BIOGEL PI INDICATOR 7.5 (GLOVE) ×1
GLOVE ECLIPSE 6.5 STRL STRAW (GLOVE) ×2 IMPLANT
GLOVE EXAM NITRILE LRG STRL (GLOVE) IMPLANT
GLOVE EXAM NITRILE XL STR (GLOVE) IMPLANT
GLOVE EXAM NITRILE XS STR PU (GLOVE) IMPLANT
GLOVE SS BIOGEL STRL SZ 7 (GLOVE) IMPLANT
GLOVE SUPERSENSE BIOGEL SZ 7 (GLOVE) ×1
GOWN STRL REUS W/ TWL LRG LVL3 (GOWN DISPOSABLE) ×2 IMPLANT
GOWN STRL REUS W/ TWL XL LVL3 (GOWN DISPOSABLE) IMPLANT
GOWN STRL REUS W/TWL 2XL LVL3 (GOWN DISPOSABLE) IMPLANT
GOWN STRL REUS W/TWL LRG LVL3 (GOWN DISPOSABLE) ×4
GOWN STRL REUS W/TWL XL LVL3 (GOWN DISPOSABLE)
HEMOSTAT SURGICEL 2X14 (HEMOSTASIS) IMPLANT
KIT BASIN OR (CUSTOM PROCEDURE TRAY) ×2 IMPLANT
KIT ROOM TURNOVER OR (KITS) ×2 IMPLANT
NDL HYPO 25X1 1.5 SAFETY (NEEDLE) ×1 IMPLANT
NEEDLE HYPO 25X1 1.5 SAFETY (NEEDLE) ×2 IMPLANT
NS IRRIG 1000ML POUR BTL (IV SOLUTION) ×2 IMPLANT
OIL CARTRIDGE MAESTRO DRILL (MISCELLANEOUS) ×2
PACK CRANIOTOMY CUSTOM (CUSTOM PROCEDURE TRAY) ×2 IMPLANT
PATTIES SURGICAL .5 X.5 (GAUZE/BANDAGES/DRESSINGS) IMPLANT
PATTIES SURGICAL .5 X3 (DISPOSABLE) IMPLANT
PATTIES SURGICAL 1X1 (DISPOSABLE) IMPLANT
PLATE 1.5/0.5 8HOLE SM SUB (Plate) ×1 IMPLANT
SCREW SELF DRILL HT 1.5/4MM (Screw) ×12 IMPLANT
SPONGE NEURO XRAY DETECT 1X3 (DISPOSABLE) IMPLANT
SPONGE SURGIFOAM ABS GEL 100 (HEMOSTASIS) ×2 IMPLANT
STAPLER VISISTAT 35W (STAPLE) ×2 IMPLANT
SUT ETHILON 3 0 FSL (SUTURE) IMPLANT
SUT ETHILON 3 0 PS 1 (SUTURE) IMPLANT
SUT NURALON 4 0 TR CR/8 (SUTURE) ×4 IMPLANT
SUT STEEL 0 (SUTURE)
SUT STEEL 0 18XMFL TIE 17 (SUTURE) IMPLANT
SUT VIC AB 2-0 CT2 18 VCP726D (SUTURE) ×4 IMPLANT
TAPE CLOTH SURG 4X10 WHT LF (GAUZE/BANDAGES/DRESSINGS) ×1 IMPLANT
TOWEL GREEN STERILE (TOWEL DISPOSABLE) ×2 IMPLANT
TOWEL GREEN STERILE FF (TOWEL DISPOSABLE) ×2 IMPLANT
TRAY FOLEY W/METER SILVER 16FR (SET/KITS/TRAYS/PACK) ×2 IMPLANT
TUBE CONNECTING 12X1/4 (SUCTIONS) ×2 IMPLANT
UNDERPAD 30X30 (UNDERPADS AND DIAPERS) ×2 IMPLANT
WATER STERILE IRR 1000ML POUR (IV SOLUTION) ×2 IMPLANT
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
## 2018-02-11 NOTE — Progress Notes (Signed)
Brown bags to [employer] - R one reinforced on arrival to unit -thumb sticking out-brown bag with belongings are taped and at FOB- [facility]

## 2018-02-11 NOTE — Progress Notes (Signed)
Per pt, if a password is needed, please use "shotgun" - boxing name

## 2018-02-11 NOTE — Progress Notes (Signed)
Attempt to call report.

## 2018-02-11 NOTE — Op Note (Signed)
BP (!) 138/97   Pulse 65   Temp 98.1 F (36.7 C) (Temporal)   Resp (!) 22   Ht 5\' 10"  (1.778 m)   Wt 74.8 kg (165 lb)   SpO2 100%   BMI 23.68 kg/m  02/11/2018  12:54 PM  PATIENT:  Steven Lloyd  42 y.o. male  PRE-OPERATIVE DIAGNOSIS:   Open depressed skulled fracture, right parietal   POST-OPERATIVE DIAGNOSIS:Open  depressed skulled fracture, right parietal  PROCEDURE:  Procedure(s): Craniectomy  And elevation right parietal DEPRESSED SKULL FRACTURE, Repair of linear scalp laceration 3.5inches  SURGEON: Surgeon(s): Coletta Memosabbell, Hoyt Leanos, MD  ASSISTANTS:none  ANESTHESIA:   general  EBL:  Total I/O In: 1050 [I.V.:1050] Out: 275 [Urine:225; Blood:50]  BLOOD ADMINISTERED:none  CELL SAVER GIVEN:none  COUNT:per nursing  DRAINS: none   SPECIMEN:  No Specimen  DICTATION: Steven Lloyd was taken to the operating room, intubated, and placed under a general anesthetic without difficulty. He was positioned supine with his head turned towards the left on a gel donut. His head was shaved, prepped and draped in a sterile manner. I irrigated the wound copiously and identified the skull defect which was rectangular with fairly uniform edges. I removed the bone fragments with a curette and alice clamps. The dura was intact. I removed all the loose pieces of bone that I found, and irrigated again. I placed a rectangular plate over the skull defect, and gelfoam along the dura.  I closed the scalp laceration after defining the edges with iris scissors. I then closed the scalp with vicryl sutures in the galeal layer, and the scalp edges with staples. I applied a sterile dressing. He was extubated and moved to the OR bed.   PLAN OF CARE: Admit to inpatient   PATIENT DISPOSITION:  PACU - hemodynamically stable.   Delay start of Pharmacological VTE agent (>24hrs) due to surgical blood loss or risk of bleeding:  yes
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
## 2018-02-11 NOTE — Transfer of Care (Signed)
Immediate Anesthesia Transfer of Care Note  Patient: Steven Lloyd  Procedure(s) Performed: ELEVATION CRANIECTOMY FOR DEPRESSED SKULL FRACTURE, Repair of linear scalp lesion (N/A Head)  Patient Location: PACU  Anesthesia Type:General  Level of Consciousness: awake, alert  and oriented  Airway & Oxygen Therapy: Patient Spontanous Breathing and Patient connected to face mask oxygen  Post-op Assessment: Report given to RN, Post -op Vital signs reviewed and stable and Patient moving all extremities X 4  Post vital signs: Reviewed and stable  Last Vitals:  Vitals Value Taken Time  BP 147/79 02/11/2018 12:37 PM  Temp    Pulse 69 02/11/2018 12:44 PM  Resp 16 02/11/2018 12:44 PM  SpO2 100 % 02/11/2018 12:44 PM  Vitals shown include unvalidated device data.  Last Pain:  Vitals:   02/11/18 0907  TempSrc:   PainSc: 7          Complications: No apparent anesthesia complications
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
## 2018-02-11 NOTE — Anesthesia Preprocedure Evaluation (Addendum)
Anesthesia Evaluation  Patient identified by MRN, date of birth, ID band Patient awake    Reviewed: Allergy & Precautions, NPO status , Patient's Chart, lab work & pertinent test results  Airway Mallampati: II  TM Distance: >3 FB Neck ROM: Full    Dental  (+) Teeth Intact, Dental Advisory Given   Pulmonary    Pulmonary exam normal        Cardiovascular Normal cardiovascular exam     Neuro/Psych    GI/Hepatic   Endo/Other    Renal/GU      Musculoskeletal   Abdominal   Peds  Hematology   Anesthesia Other Findings   Reproductive/Obstetrics                            Anesthesia Physical Anesthesia Plan  ASA: III and emergent  Anesthesia Plan: General   Post-op Pain Management:    Induction: Intravenous  PONV Risk Score and Plan: Treatment may vary due to age or medical condition  Airway Management Planned: Oral ETT  Additional Equipment:   Intra-op Plan:   Post-operative Plan: Extubation in OR  Informed Consent: I have reviewed the patients History and Physical, chart, labs and discussed the procedure including the risks, benefits and alternatives for the proposed anesthesia with the patient or authorized representative who has indicated his/her understanding and acceptance.   Dental advisory given  Plan Discussed with: CRNA and Surgeon  Anesthesia Plan Comments:        Anesthesia Quick Evaluation

## 2018-02-11 NOTE — Progress Notes (Signed)
Orthopedic Tech Progress Note Patient Details:  Steven Lloyd 11/22/1874 [id number]  Patient ID: Steven Lloyd, male   DOB: 11/22/1874, [age] y.o.   MRN: [id number]   Nikki DomCrawford, Alexandre Faries 02/11/2018, 9:18 AM Made level 1 trauma visit

## 2018-02-11 NOTE — Anesthesia Postprocedure Evaluation (Signed)
Anesthesia Post Note  Patient: Steven Lloyd  Procedure(s) Performed: ELEVATION CRANIECTOMY FOR DEPRESSED SKULL FRACTURE, Repair of linear scalp lesion (N/A Head)     Patient location during evaluation: PACU Anesthesia Type: General Level of consciousness: awake and alert Pain management: pain level controlled Vital Signs Assessment: post-procedure vital signs reviewed and stable Respiratory status: spontaneous breathing, nonlabored ventilation, respiratory function stable and patient connected to nasal cannula oxygen Cardiovascular status: blood pressure returned to baseline and stable Postop Assessment: no apparent nausea or vomiting Anesthetic complications: no    Last Vitals:  Vitals:   02/11/18 1238 02/11/18 1253  BP: (!) 147/79 (!) 146/71  Pulse: 68 63  Resp: (!) 7 (!) 8  Temp: (!) 36.3 C   SpO2: 100% 100%    Last Pain:  Vitals:   02/11/18 0907  TempSrc:   PainSc: 7                  [name]
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
## 2018-02-11 NOTE — Progress Notes (Signed)
Pt's wife and mother updated in surgical waiting room -sent up to 4N waiting area
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
## 2018-02-11 NOTE — Anesthesia Procedure Notes (Signed)
Procedure Name: Intubation Date/Time: 02/11/2018 11:05 AM Performed by: Kyung Rudd, CRNA Pre-anesthesia Checklist: Patient identified, Emergency Drugs available, Suction available and Patient being monitored Patient Re-evaluated:Patient Re-evaluated prior to induction Oxygen Delivery Method: Circle system utilized Preoxygenation: Pre-oxygenation with 100% oxygen Induction Type: IV induction, Rapid sequence and Cricoid Pressure applied Laryngoscope Size: Mac and 4 Grade View: Grade I Tube type: Oral Tube size: 7.5 mm Number of attempts: 1 Airway Equipment and Method: Stylet Placement Confirmation: ETT inserted through vocal cords under direct vision,  positive ETCO2 and breath sounds checked- equal and bilateral Secured at: 21 cm Tube secured with: Tape Dental Injury: Teeth and Oropharynx as per pre-operative assessment
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
## 2018-02-12 LAB — CBC
HEMATOCRIT: 42.1 % (ref 39.0–52.0)
Hemoglobin: 13.8 g/dL (ref 13.0–17.0)
MCH: 27.8 pg (ref 26.0–34.0)
MCHC: 32.8 g/dL (ref 30.0–36.0)
MCV: 84.7 fL (ref 78.0–100.0)
PLATELETS: 269 10*3/uL (ref 150–400)
RBC: 4.97 MIL/uL (ref 4.22–5.81)
RDW: 13.1 % (ref 11.5–15.5)
WBC: 10 10*3/uL (ref 4.0–10.5)

## 2018-02-12 LAB — BASIC METABOLIC PANEL
ANION GAP: 8 (ref 5–15)
BUN: 7 mg/dL (ref 6–20)
CO2: 26 mmol/L (ref 22–32)
Calcium: 8.6 mg/dL — ABNORMAL LOW (ref 8.9–10.3)
Chloride: 105 mmol/L (ref 101–111)
Creatinine, Ser: 1.48 mg/dL — ABNORMAL HIGH (ref 0.61–1.24)
GFR, EST NON AFRICAN AMERICAN: 58 mL/min — AB (ref >60)
Glucose, Bld: 107 mg/dL — ABNORMAL HIGH (ref 65–99)
Potassium: 4 mmol/L (ref 3.5–5.1)
Sodium: 139 mmol/L (ref 135–145)

## 2018-02-12 MED ORDER — WHITE PETROLATUM EX OINT
TOPICAL_OINTMENT | CUTANEOUS | Status: AC
  Administered 2018-02-12: 0.2
  Filled 2018-02-12: qty 28.35
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
## 2018-02-12 NOTE — Consult Note (Signed)
Patient was seen in his room. He reports severe pain and says he is too tired to be interviewed today. He requested for in psychiatric interview to be postponed till another day. He states that he will inform his surgeon when he is ready for interview. Please re-consult psychiatric service when patient is ready to be evaluated.  Steven MinsMojeed Tyjanae Bartek, MD

## 2018-02-13 ENCOUNTER — Encounter (HOSPITAL_COMMUNITY): Payer: Self-pay | Admitting: Emergency Medicine

## 2018-02-13 DIAGNOSIS — F322 Major depressive disorder, single episode, severe without psychotic features: Secondary | ICD-10-CM

## 2018-02-13 LAB — BASIC METABOLIC PANEL
Anion gap: 7 (ref 5–15)
CALCIUM: 8.6 mg/dL — AB (ref 8.9–10.3)
CO2: 27 mmol/L (ref 22–32)
Chloride: 102 mmol/L (ref 101–111)
Creatinine, Ser: 1.41 mg/dL — ABNORMAL HIGH (ref 0.61–1.24)
GFR calc Af Amer: >60 mL/min (ref >60)
Glucose, Bld: 119 mg/dL — ABNORMAL HIGH (ref 65–99)
Potassium: 4.3 mmol/L (ref 3.5–5.1)
SODIUM: 136 mmol/L (ref 135–145)

## 2018-02-13 LAB — BLOOD PRODUCT ORDER (VERBAL) VERIFICATION

## 2018-02-13 MED ORDER — HYDROCODONE-ACETAMINOPHEN 5-325 MG PO TABS
1.0000 | ORAL_TABLET | ORAL | Status: DC | PRN
  Administered 2018-02-13 – 2018-02-14 (×7): 2 via ORAL
  Filled 2018-02-13 (×7): qty 2

## 2018-02-13 MED FILL — Thrombin For Soln 20000 Unit: CUTANEOUS | Qty: 1 | Status: AC
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
## 2018-02-13 NOTE — Progress Notes (Signed)
Patient ID: Steven Lloyd, male   DOB: [birth year], 42 y.o.   MRN: [id number] BP 113/88   Pulse 95   Temp 98.8 F (37.1 C) (Oral)   Resp 16   Ht 5\' 10"  (1.778 m)   Wt 74.8 kg (165 lb)   SpO2 94%   BMI 23.68 kg/m  Alert and oriented x 4 Moving all extremities well Perrl, full eom No drift 5/5 strength in extremities Symmetric facies, tongue and uvula midline.  May be discharge at any time.

## 2018-02-13 NOTE — Progress Notes (Signed)
SLP Cancellation Note  Patient Details Name: Karl LukeJames C Yoakum MRN: [id number] DOB: March 17, 1976   Cancelled treatment:       Reason Eval/Treat Not Completed: Other (comment). Attempted x2 this am, with other providers. Will f/u.    Markiah Janeway, Riley NearingBonnie Caroline 02/13/2018, 10:23 AM

## 2018-02-13 NOTE — Discharge Instructions (Addendum)
Craniotomy °Care After °Please read the instructions outlined below and refer to this sheet in the next few weeks. These discharge instructions provide you with general information on caring for yourself after you leave the hospital. Your surgeon may also give you specific instructions. While your treatment has been planned according to the most current medical practices available, unavoidable complications occasionally occur. If you have any problems or questions after discharge, please call your surgeon. °Although there are many types of brain surgery, recovery following craniotomy (surgical opening of the skull) is much the same for each. However, recovery depends on many factors. These include the type and severity of brain injury and the type of surgery. It also depends on any nervous system function problems (neurological deficits) before surgery. If the craniotomy was done for cancer, chemotherapy and radiation could follow. You could be in the hospital from 5 days to a couple weeks. This depends on the type of surgery, findings, and whether there are complications. °HOME CARE INSTRUCTIONS  °· It is not unusual to hear a clicking noise after a craniotomy, the plates and screws used to attach the bone flap can sometimes cause this. It is a normal occurrence if this does happen °· Do not drive for 10 days after the operation °· Your scalp may feel spongy for a while, because of fluid under it. This will gradually get better. Occasionally, the surgeon will not replace the bone that was removed to access the brain. If there is a bony defect, the surgeon will ask you to wear a helmet for protection. This is a discussion you should have with your surgeon prior to leaving the hospital (discharge). °· Numbness may persist in some areas of your scalp. °· Take all medications as directed. Sometimes steroids to control swelling are prescribed. Anticonvulsants to prevent seizures may also be given. Do not use alcohol,  other drugs, or medications unless your surgeon says it is OK. °· Keep the wound dry and clean. The wound may be washed gently with soap and water. Then, you may gently blot or dab it dry, without rubbing. Do not take baths, use swimming pools or hot tubs for 10 days, or as instructed by your caregiver. It is best to wait to see you surgeon at your first postoperative visit, and to get directions at that time. °· Only take over-the-counter or prescription medicines for pain, discomfort, or fever as directed by your caregiver. °· You may continue your normal diet, as directed. °· Walking is OK for exercise. Wait at least 3 months before you return to mild, non-contact sports or as your surgeon suggests. Contact sports should be avoided for at least 1 year, unless your surgeon says it is OK. °· If you are prescribed steroids, take them exactly as prescribed. If you start having a decrease in nervous system functions (neurological deficits) and headaches as the dose of steroids is reduced, tell your surgeon right away. °· When the anticonvulsant prescription is finished you no longer need to take it. °SEEK IMMEDIATE MEDICAL CARE IF:  °· You develop nausea, vomiting, severe headaches, confusion, or you have a seizure. °· You develop chest pain, a stiff neck, or difficulty breathing. °· There is redness, swelling, or increasing pain in the wound or pin insertion sites. °· You have an increase in swelling or bruising around the eyes. °· There is drainage or pus coming from the wound. °· You have an oral temperature above 102° F (38.9° C), not controlled by medicine. °·   You notice a foul smell coming from the wound or dressing.  The wound breaks open (edges not staying together) after the stitches have been removed.  You develop dizziness or fainting while standing.  You develop a rash.  You develop any reaction or side effects to the medications given. Document Released: 02/08/2006 Document Revised: 01/31/2012  Document Reviewed: 11/17/2009 [facility] Patient Information 2013 [place], [place].  1. PAIN CONTROL:  1. Pain is best controlled by a usual combination of three different methods TOGETHER:  1. Ice/Heat 2. Over the counter pain medication 3. Prescription pain medication 2. Most patients will experience some swelling and bruising around wounds. Ice packs or heating pads (30-60 minutes up to 6 times a day) will help. Use ice for the first few days to help decrease swelling and bruising, then switch to heat to help relax tight/sore spots and speed recovery. Some people prefer to use ice alone, heat alone, alternating between ice & heat. Experiment to what works for you. Swelling and bruising can take several weeks to resolve.  3. It is helpful to take an over-the-counter pain medication regularly for the first few weeks. Choose one of the following that works best for you:  1. Naproxen (Aleve, etc) Two 220mg  tabs twice a day 2. Ibuprofen (Advil, etc) Three 200mg  tabs four times a day (every meal & bedtime) 3. Acetaminophen (Tylenol, etc) 500-650mg  four times a day (every meal & bedtime) 4. A prescription for pain medication (such as oxycodone, hydrocodone, etc) should be given to you upon discharge. Take your pain medication as prescribed.  1. If you are having problems/concerns with the prescription medicine (does not control pain, nausea, vomiting, rash, itching, etc), please call us [phone] to see if we need to switch you to a different pain medicine that will work better for you and/or control your side effect better. 2. If you need a refill on your pain medication, please contact your pharmacy. They will contact our office to request authorization. Prescriptions will not be filled after 5 pm or on week-ends. 4. Avoid getting constipated. When taking pain medications, it is common to experience some constipation. Increasing fluid intake and taking a fiber supplement (such as Metamucil, Citrucel,  FiberCon, MiraLax, etc) 1-2 times a day regularly will usually help prevent this problem from occurring. A mild laxative (prune juice, Milk of Magnesia, MiraLax, etc) should be taken according to package directions if there are no bowel movements after 48 hours.  5. Watch out for diarrhea. If you have many loose bowel movements, simplify your diet to bland foods & liquids for a few days. Stop any stool softeners and decrease your fiber supplement. Switching to mild anti-diarrheal medications (Kayopectate, Pepto Bismol) can help. If this worsens or does not improve, please call us. 6. Wash / shower every day. You may shower daily and replace your bandges after showering. No bathing or submerging your wounds in water until they heal.  Please call [facility] at [phone] with any questions or concerns  WHEN TO CALL us [phone]:  1. Poor pain control 2. Reactions / problems with new medications (rash/itching, nausea, etc)  3. Fever over 101.5 F (38.5 C) 4. Worsening swelling or bruising 5. Continued bleeding from wounds. 6. Increased pain, redness, or drainage from the wounds which could be signs of infection  The clinic staff is available to answer your questions during regular business hours (8:30am-5pm). Please dont hesitate to call and ask to speak to one of our nurses for  clinical concerns.  If you have a medical emergency, go to the nearest emergency room or call 911.  [facility] is always on call at the [facility]   [facility] [place]  [address], [address], [place], [place] [address] ?  MAIN: [phone] ? TOLL FREE: [phone] ?  FAX [phone]  [email or web address]
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
## 2018-02-14 ENCOUNTER — Other Ambulatory Visit: Payer: Self-pay

## 2018-02-14 ENCOUNTER — Encounter (HOSPITAL_COMMUNITY): Payer: Self-pay | Admitting: *Deleted

## 2018-02-14 ENCOUNTER — Inpatient Hospital Stay (HOSPITAL_COMMUNITY)
Admission: AD | Admit: 2018-02-14 | Discharge: 2018-02-23 | DRG: 885 | Disposition: A | Discharge status: Home / Self Care | Payer: No Typology Code available for payment source | Attending: Emergency Medicine | Admitting: Emergency Medicine

## 2018-02-14 DIAGNOSIS — S065X9D Traumatic subdural hemorrhage with loss of consciousness of unspecified duration, subsequent encounter: Secondary | ICD-10-CM | POA: Diagnosis not present

## 2018-02-14 DIAGNOSIS — F1290 Cannabis use, unspecified, uncomplicated: Secondary | ICD-10-CM | POA: Diagnosis not present

## 2018-02-14 DIAGNOSIS — R079 Chest pain, unspecified: Secondary | ICD-10-CM | POA: Diagnosis not present

## 2018-02-14 DIAGNOSIS — G4700 Insomnia, unspecified: Secondary | ICD-10-CM | POA: Diagnosis present

## 2018-02-14 DIAGNOSIS — S020XXB Fracture of vault of skull, initial encounter for open fracture: Secondary | ICD-10-CM | POA: Diagnosis not present

## 2018-02-14 DIAGNOSIS — R600 Localized edema: Secondary | ICD-10-CM | POA: Diagnosis not present

## 2018-02-14 DIAGNOSIS — F332 Major depressive disorder, recurrent severe without psychotic features: Secondary | ICD-10-CM | POA: Diagnosis not present

## 2018-02-14 DIAGNOSIS — G8929 Other chronic pain: Secondary | ICD-10-CM | POA: Diagnosis not present

## 2018-02-14 DIAGNOSIS — R609 Edema, unspecified: Secondary | ICD-10-CM | POA: Diagnosis not present

## 2018-02-14 DIAGNOSIS — Z818 Family history of other mental and behavioral disorders: Secondary | ICD-10-CM | POA: Diagnosis not present

## 2018-02-14 DIAGNOSIS — K219 Gastro-esophageal reflux disease without esophagitis: Secondary | ICD-10-CM | POA: Diagnosis present

## 2018-02-14 DIAGNOSIS — F410 Panic disorder [episodic paroxysmal anxiety] without agoraphobia: Secondary | ICD-10-CM | POA: Diagnosis not present

## 2018-02-14 DIAGNOSIS — S066X9D Traumatic subarachnoid hemorrhage with loss of consciousness of unspecified duration, subsequent encounter: Secondary | ICD-10-CM | POA: Diagnosis not present

## 2018-02-14 DIAGNOSIS — S020XXD Fracture of vault of skull, subsequent encounter for fracture with routine healing: Secondary | ICD-10-CM

## 2018-02-14 DIAGNOSIS — N182 Chronic kidney disease, stage 2 (mild): Secondary | ICD-10-CM | POA: Diagnosis present

## 2018-02-14 DIAGNOSIS — I824Z2 Acute embolism and thrombosis of unspecified deep veins of left distal lower extremity: Secondary | ICD-10-CM

## 2018-02-14 DIAGNOSIS — Z79899 Other long term (current) drug therapy: Secondary | ICD-10-CM | POA: Diagnosis not present

## 2018-02-14 DIAGNOSIS — W3400XA Accidental discharge from unspecified firearms or gun, initial encounter: Secondary | ICD-10-CM | POA: Diagnosis not present

## 2018-02-14 DIAGNOSIS — R4587 Impulsiveness: Secondary | ICD-10-CM | POA: Diagnosis not present

## 2018-02-14 DIAGNOSIS — Z630 Problems in relationship with spouse or partner: Secondary | ICD-10-CM | POA: Diagnosis not present

## 2018-02-14 DIAGNOSIS — F419 Anxiety disorder, unspecified: Secondary | ICD-10-CM | POA: Diagnosis present

## 2018-02-14 DIAGNOSIS — X749XXD Intentional self-harm by unspecified firearm discharge, subsequent encounter: Secondary | ICD-10-CM

## 2018-02-14 DIAGNOSIS — T1491XA Suicide attempt, initial encounter: Secondary | ICD-10-CM | POA: Diagnosis not present

## 2018-02-14 DIAGNOSIS — R42 Dizziness and giddiness: Secondary | ICD-10-CM

## 2018-02-14 DIAGNOSIS — S069X9A Unspecified intracranial injury with loss of consciousness of unspecified duration, initial encounter: Secondary | ICD-10-CM | POA: Diagnosis not present

## 2018-02-14 DIAGNOSIS — I129 Hypertensive chronic kidney disease with stage 1 through stage 4 chronic kidney disease, or unspecified chronic kidney disease: Secondary | ICD-10-CM | POA: Diagnosis present

## 2018-02-14 DIAGNOSIS — Z915 Personal history of self-harm: Secondary | ICD-10-CM

## 2018-02-14 MED ORDER — TAMSULOSIN HCL 0.4 MG PO CAPS
0.4000 mg | ORAL_CAPSULE | Freq: Every day | ORAL | Status: DC
  Administered 2018-02-15 – 2018-02-23 (×9): 0.4 mg via ORAL
  Filled 2018-02-14 (×4): qty 1
  Filled 2018-02-14: qty 7
  Filled 2018-02-14 (×8): qty 1

## 2018-02-14 MED ORDER — ONDANSETRON 4 MG PO TBDP: 4.0000 mg | ORAL_TABLET | Freq: Four times a day (QID) | ORAL | 0 refills | Status: DC | PRN

## 2018-02-14 MED ORDER — TRAZODONE HCL 50 MG PO TABS
50.0000 mg | ORAL_TABLET | Freq: Every evening | ORAL | Status: DC | PRN
  Administered 2018-02-14 – 2018-02-22 (×9): 50 mg via ORAL
  Filled 2018-02-14 (×29): qty 1

## 2018-02-14 MED ORDER — PANTOPRAZOLE SODIUM 40 MG PO TBEC
40.0000 mg | DELAYED_RELEASE_TABLET | Freq: Every day | ORAL | Status: DC
  Administered 2018-02-15 – 2018-02-23 (×9): 40 mg via ORAL
  Filled 2018-02-14 (×2): qty 1
  Filled 2018-02-14: qty 7
  Filled 2018-02-14 (×11): qty 1

## 2018-02-14 MED ORDER — HYDROCODONE-ACETAMINOPHEN 5-325 MG PO TABS: 1.0000 | ORAL_TABLET | ORAL | 0 refills | Status: DC | PRN

## 2018-02-14 MED ORDER — LEVETIRACETAM 500 MG PO TABS: 500.0000 mg | ORAL_TABLET | Freq: Two times a day (BID) | ORAL | Status: DC

## 2018-02-14 MED ORDER — IBUPROFEN 200 MG PO TABS
600.0000 mg | ORAL_TABLET | Freq: Four times a day (QID) | ORAL | Status: DC | PRN
  Administered 2018-02-15 – 2018-02-20 (×3): 600 mg via ORAL
  Filled 2018-02-14 (×3): qty 1

## 2018-02-14 MED ORDER — PROMETHAZINE HCL 25 MG PO TABS: 12.5000 mg | ORAL_TABLET | ORAL | Status: DC | PRN

## 2018-02-14 MED ORDER — ALUM & MAG HYDROXIDE-SIMETH 200-200-20 MG/5ML PO SUSP: 30.0000 mL | ORAL | Status: DC | PRN

## 2018-02-14 MED ORDER — HYDROCODONE-ACETAMINOPHEN 5-325 MG PO TABS: 1.0000 | ORAL_TABLET | Freq: Four times a day (QID) | ORAL | 0 refills | Status: DC | PRN

## 2018-02-14 MED ORDER — ACETAMINOPHEN 325 MG PO TABS: 325.0000 mg | ORAL_TABLET | Freq: Four times a day (QID) | ORAL | Status: DC | PRN

## 2018-02-14 MED ORDER — GABAPENTIN 300 MG PO CAPS
300.0000 mg | ORAL_CAPSULE | Freq: Two times a day (BID) | ORAL | Status: DC
  Administered 2018-02-15 – 2018-02-18 (×8): 300 mg via ORAL
  Filled 2018-02-14 (×17): qty 1

## 2018-02-14 MED ORDER — LEVETIRACETAM 500 MG PO TABS
500.0000 mg | ORAL_TABLET | Freq: Two times a day (BID) | ORAL | Status: DC
  Administered 2018-02-14 – 2018-02-23 (×18): 500 mg via ORAL
  Filled 2018-02-14 (×3): qty 1
  Filled 2018-02-14: qty 2
  Filled 2018-02-14: qty 14
  Filled 2018-02-14 (×8): qty 1
  Filled 2018-02-14: qty 14
  Filled 2018-02-14 (×14): qty 1

## 2018-02-14 MED ORDER — ACETAMINOPHEN 325 MG PO TABS: 650.0000 mg | ORAL_TABLET | Freq: Four times a day (QID) | ORAL | Status: DC | PRN

## 2018-02-14 MED ORDER — MAGNESIUM HYDROXIDE 400 MG/5ML PO SUSP
30.0000 mL | Freq: Every day | ORAL | Status: DC | PRN
  Filled 2018-02-14: qty 30

## 2018-02-14 MED ORDER — KETOROLAC TROMETHAMINE 60 MG/2ML IM SOLN
15.0000 mg | Freq: Once | INTRAMUSCULAR | Status: AC
  Administered 2018-02-14: 15 mg via INTRAMUSCULAR
  Filled 2018-02-14 (×2): qty 2

## 2018-02-14 MED ORDER — LEVETIRACETAM 500 MG PO TABS
500.0000 mg | ORAL_TABLET | Freq: Two times a day (BID) | ORAL | Status: DC
  Administered 2018-02-14: 500 mg via ORAL
  Filled 2018-02-14: qty 1

## 2018-02-14 MED ORDER — HYDROXYZINE HCL 25 MG PO TABS
25.0000 mg | ORAL_TABLET | Freq: Four times a day (QID) | ORAL | Status: DC | PRN
  Administered 2018-02-16 – 2018-02-22 (×7): 25 mg via ORAL
  Filled 2018-02-14: qty 1
  Filled 2018-02-14: qty 10
  Filled 2018-02-14 (×5): qty 1
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
## 2018-02-14 NOTE — Clinical Social Work Note (Signed)
Clinical Social Worker facilitated patient discharge including contacting patient and facility to confirm patient discharge plans.  Clinical information faxed to facility and patient agreeable with plan.  CS[facility]  RN to call report prior to discharge.  Attending:  Dr. Tamera Puntleary # for Report:  [phone] Room 402-1 Voluntary Admission   Clinical Social Worker will sign off for now as social work intervention is no longer needed. Please consult us again if new need arises.  Steven GoldsJesse Zacchaeus Lloyd, Kentucky[place]CSW [phone]

## 2018-02-14 NOTE — Tx Team (Signed)
Initial Treatment Plan 02/14/2018 6:23 PM Steven LukeJames C Derego ZOX:[id number]    PATIENT STRESSORS: Marital or family conflict Substance abuse   PATIENT STRENGTHS: Ability for insight Average or above average intelligence Capable of independent living General fund of knowledge Motivation for treatment/growth   PATIENT IDENTIFIED PROBLEMS: Depression Suicidal thoughts "Depression and making wrong choices"                     DISCHARGE CRITERIA:  Ability to meet basic life and health needs Improved stabilization in mood, thinking, and/or behavior Reduction of life-threatening or endangering symptoms to within safe limits Verbal commitment to aftercare and medication compliance  PRELIMINARY DISCHARGE PLAN: Attend aftercare/continuing care group Return to previous living arrangement  PATIENT/FAMILY INVOLVEMENT: This treatment plan has been presented to and reviewed with the patient, Steven Lloyd, and/or family member, .  The patient and family have been given the opportunity to ask questions and make suggestions.  Fortino Haag, [place], [place]RN 02/14/2018, 6:23 PM
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
## 2018-02-14 NOTE — Progress Notes (Signed)
SLP Cancellation Note  Patient Details Name: Steven Lloyd MRN: [id number] DOB: Dec 11, 1975   Cancelled treatment:       Reason Eval/Treat Not Completed: Other (comment). Given plan for d/c to inpatient psychiatric admission with defer any further standardized cognitive testing as pt has been  appropriate with staff and is expected to have neuropysch f/u at next level of care. SLP will sign off.    Tarsha Blando, Riley NearingBonnie Caroline 02/14/2018, 11:12 AM

## 2018-02-14 NOTE — Progress Notes (Addendum)
[facility], [facility] transport here to pick up pt @1600 .  Discharge instructions reviewed with pt and wife.  Pt and wife verbalized understanding and had no questions. [facility] condition via wheelchair.  Wife also with patient.  Hector ShadeMoss, Consuella Scurlock [place]

## 2018-02-15 DIAGNOSIS — W3400XA Accidental discharge from unspecified firearms or gun, initial encounter: Secondary | ICD-10-CM

## 2018-02-15 DIAGNOSIS — Z630 Problems in relationship with spouse or partner: Secondary | ICD-10-CM

## 2018-02-15 DIAGNOSIS — S069X9A Unspecified intracranial injury with loss of consciousness of unspecified duration, initial encounter: Secondary | ICD-10-CM

## 2018-02-15 DIAGNOSIS — Z818 Family history of other mental and behavioral disorders: Secondary | ICD-10-CM

## 2018-02-15 DIAGNOSIS — R4587 Impulsiveness: Secondary | ICD-10-CM

## 2018-02-15 DIAGNOSIS — S020XXB Fracture of vault of skull, initial encounter for open fracture: Secondary | ICD-10-CM

## 2018-02-15 DIAGNOSIS — T1491XA Suicide attempt, initial encounter: Secondary | ICD-10-CM

## 2018-02-15 MED ORDER — FLUOXETINE HCL 10 MG PO CAPS
10.0000 mg | ORAL_CAPSULE | Freq: Every day | ORAL | Status: DC
  Administered 2018-02-15 – 2018-02-18 (×4): 10 mg via ORAL
  Filled 2018-02-15 (×8): qty 1

## 2018-02-15 MED ORDER — HYDROCODONE-ACETAMINOPHEN 5-325 MG PO TABS: 1.0000 | ORAL_TABLET | Freq: Four times a day (QID) | ORAL | Status: DC | PRN

## 2018-02-15 MED ORDER — HYDROCODONE-ACETAMINOPHEN 5-325 MG PO TABS
1.0000 | ORAL_TABLET | Freq: Four times a day (QID) | ORAL | Status: DC | PRN
  Administered 2018-02-15 – 2018-02-16 (×4): 2 via ORAL
  Administered 2018-02-16 – 2018-02-17 (×3): 1 via ORAL
  Filled 2018-02-15 (×4): qty 2
  Filled 2018-02-15 (×4): qty 1
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
## 2018-02-15 NOTE — Tx Team (Signed)
Interdisciplinary Treatment and Diagnostic Plan Update  02/15/2018 Time of Session: 9:30am Steven Lloyd MRN: [id number]  Principal Diagnosis: <principal problem not specified>  Secondary Diagnoses: Active Problems:   MDD (major depressive disorder), single episode, severe , no psychosis (HCC)   MDD (major depressive disorder), recurrent episode, severe (HCC)   Current Medications:  Current Facility-Administered Medications  Medication Dose Route Frequency Provider Last Rate Last Dose  . alum & mag hydroxide-simeth (MAALOX/MYLANTA) 200-200-20 MG/5ML suspension 30 mL  30 mL Oral Q4H PRN Kerry Hough, PA-C      . FLUoxetine (PROZAC) capsule 10 mg  10 mg Oral Daily Antonieta Pert, MD   10 mg at 02/16/18 0800  . gabapentin (NEURONTIN) capsule 300 mg  300 mg Oral BID Kerry Hough, PA-C   300 mg at 02/16/18 0800  . HYDROcodone-acetaminophen (NORCO/VICODIN) 5-325 MG per tablet 1-2 tablet  1-2 tablet Oral Q6H PRN Antonieta Pert, MD   2 tablet at 02/16/18 0804  . hydrOXYzine (ATARAX/VISTARIL) tablet 25 mg  25 mg Oral Q6H PRN Kerry Hough, PA-C   25 mg at 02/16/18 5638  . ibuprofen (ADVIL,MOTRIN) tablet 600 mg  600 mg Oral Q6H PRN Kerry Hough, PA-C   600 mg at 02/15/18 0831  . levETIRAcetam (KEPPRA) tablet 500 mg  500 mg Oral BID Okonkwo, Justina A, NP   500 mg at 02/16/18 0801  . magnesium hydroxide (MILK OF MAGNESIA) suspension 30 mL  30 mL Oral Daily PRN Donell Sievert E, PA-C      . pantoprazole (PROTONIX) EC tablet 40 mg  40 mg Oral Daily Okonkwo, Justina A, NP   40 mg at 02/16/18 0801  . promethazine (PHENERGAN) tablet 12.5-25 mg  12.5-25 mg Oral Q4H PRN Okonkwo, Justina A, NP      . tamsulosin (FLOMAX) capsule 0.4 mg  0.4 mg Oral Daily Okonkwo, Justina A, NP   0.4 mg at 02/16/18 0801  . traZODone (DESYREL) tablet 50 mg  50 mg Oral QHS,MR X 1 Kerry Hough, PA-C   50 mg at 02/15/18 2303   PTA Medications: Medications Prior to Admission  Medication Sig  Dispense Refill Last Dose  . acetaminophen (TYLENOL) 325 MG tablet Take 1 tablet (325 mg total) by mouth every 6 (six) hours as needed for mild pain (temp > 100.5).     Marland Kitchen HYDROcodone-acetaminophen (NORCO/VICODIN) 5-325 MG tablet Take 1 tablet by mouth every 6 (six) hours as needed for moderate pain. 20 tablet 0   . levETIRAcetam (KEPPRA) 500 MG tablet Take 1 tablet (500 mg total) by mouth 2 (two) times daily for 14 days.     . ondansetron (ZOFRAN-ODT) 4 MG disintegrating tablet Take 1 tablet (4 mg total) by mouth every 6 (six) hours as needed for nausea. 20 tablet 0     Patient Stressors: Marital or family conflict Substance abuse  Patient Strengths: Ability for insight Average or above average intelligence Capable of independent living [employer] of knowledge Motivation for treatment/growth  Treatment Modalities: Medication Management, Group therapy, Case management,  1 to 1 session with clinician, Psychoeducation, Recreational therapy.   Physician Treatment Plan for Primary Diagnosis: <principal problem not specified> Long Term Goal(s): Improvement in symptoms so as ready for discharge Improvement in symptoms so as ready for discharge   Short Term Goals: Ability to identify changes in lifestyle to reduce recurrence of condition will improve Ability to verbalize feelings will improve Ability to disclose and discuss suicidal ideas Ability to demonstrate self-control will  improve Ability to identify and develop effective coping behaviors will improve Ability to maintain clinical measurements within normal limits will improve Compliance with prescribed medications will improve Ability to identify triggers associated with substance abuse/mental health issues will improve Ability to identify changes in lifestyle to reduce recurrence of condition will improve Ability to verbalize feelings will improve Ability to disclose and discuss suicidal ideas Ability to demonstrate self-control  will improve Ability to identify and develop effective coping behaviors will improve Ability to maintain clinical measurements within normal limits will improve Compliance with prescribed medications will improve  Medication Management: Evaluate patient's response, side effects, and tolerance of medication regimen.  Therapeutic Interventions: 1 to 1 sessions, Unit Group sessions and Medication administration.  Evaluation of Outcomes: Progressing  Physician Treatment Plan for Secondary Diagnosis: Active Problems:   MDD (major depressive disorder), single episode, severe , no psychosis (HCC)   MDD (major depressive disorder), recurrent episode, severe (HCC)  Long Term Goal(s): Improvement in symptoms so as ready for discharge Improvement in symptoms so as ready for discharge   Short Term Goals: Ability to identify changes in lifestyle to reduce recurrence of condition will improve Ability to verbalize feelings will improve Ability to disclose and discuss suicidal ideas Ability to demonstrate self-control will improve Ability to identify and develop effective coping behaviors will improve Ability to maintain clinical measurements within normal limits will improve Compliance with prescribed medications will improve Ability to identify triggers associated with substance abuse/mental health issues will improve Ability to identify changes in lifestyle to reduce recurrence of condition will improve Ability to verbalize feelings will improve Ability to disclose and discuss suicidal ideas Ability to demonstrate self-control will improve Ability to identify and develop effective coping behaviors will improve Ability to maintain clinical measurements within normal limits will improve Compliance with prescribed medications will improve     Medication Management: Evaluate patient's response, side effects, and tolerance of medication regimen.  Therapeutic Interventions: 1 to 1 sessions, Unit  Group sessions and Medication administration.  Evaluation of Outcomes: Progressing   RN Treatment Plan for Primary Diagnosis: <principal problem not specified> Long Term Goal(s): Knowledge of disease and therapeutic regimen to maintain health will improve  Short Term Goals: Ability to remain free from injury will improve, Ability to verbalize feelings will improve, Ability to disclose and discuss suicidal ideas and Compliance with prescribed medications will improve  Medication Management: RN will administer medications as ordered by provider, will assess and evaluate patient's response and provide education to patient for prescribed medication. RN will report any adverse and/or side effects to prescribing provider.  Therapeutic Interventions: 1 on 1 counseling sessions, Psychoeducation, Medication administration, Evaluate responses to treatment, Monitor vital signs and CBGs as ordered, Perform/monitor CIWA, COWS, AIMS and Fall Risk screenings as ordered, Perform wound care treatments as ordered.  Evaluation of Outcomes: Progressing   LCSW Treatment Plan for Primary Diagnosis: <principal problem not specified> Long Term Goal(s): Safe transition to appropriate next level of care at discharge, Engage patient in therapeutic group addressing interpersonal concerns.  Short Term Goals: Engage patient in aftercare planning with referrals and resources, Increase social support, Increase emotional regulation and Increase skills for wellness and recovery  Therapeutic Interventions: Assess for all discharge needs, 1 to 1 time with Social worker, Explore available resources and support systems, Assess for adequacy in community support network, Educate family and significant other(s) on suicide prevention, Complete Psychosocial Assessment, Interpersonal group therapy.  Evaluation of Outcomes: Progressing   Progress in Treatment: Attending groups: Yes.  Participating in groups: Yes. Taking medication  as prescribed: Yes. Toleration medication: Yes. Family/Significant other contact made: No, will contact:  wife Patient understands diagnosis: Yes. Discussing patient identified problems/goals with staff: Yes. Medical problems stabilized or resolved: Yes. Being treated for head wound and pain management. Denies suicidal/homicidal ideation: Yes.  Issues/concerns per patient self-inventory: No.  New problem(s) identified: No, Describe:  CSW continuing to assess  New Short Term/Long Term Goal(s): Patient's goal "get better."  Discharge Plan or Barriers: Patient is unsure but may live with his mother. Will follow up.  Reason for Continuation of Hospitalization: Anxiety Depression Medication stabilization Suicidal ideation  Estimated Length of Stay: Continuing to assess  Attendees: Patient: Steven Lloyd 02/16/2018 8:13 AM  Physician: Marguerita Merles 02/16/2018 8:13 AM  Nursing: Rodell Perna, RN 02/16/2018 8:13 AM  RN Care Manager: 02/16/2018 8:13 AM  Social Worker: Garner Nash, CSW 02/16/2018 8:13 AM  Recreational Therapist:  02/16/2018 8:13 AM  Other: Enid Cutter, Social Work Intern 02/16/2018 8:13 AM  Other:  02/16/2018 8:13 AM  Other: 02/16/2018 8:13 AM    Scribe for Treatment Team: Darreld Mclean, Student-Social Work 02/16/2018 8:13 AM

## 2018-02-15 NOTE — Progress Notes (Signed)
Adult Psychoeducational Group Note  Date:  02/15/2018 Time:  9:28 PM  Group Topic/Focus:  Wrap-Up Group:   The focus of this group is to help patients review their daily goal of treatment and discuss progress on daily workbooks.  Participation Level:  Active  Participation Quality:  Appropriate  Affect:  Appropriate  Cognitive:  Alert and Oriented  Insight: Good  Engagement in Group:  Engaged  Modes of Intervention:  Discussion  Additional Comments:  Pt attended group this evening. Pt rated his day a 10/10.  Leo GrosserMegan A Cherry [place] 02/15/2018, 9:28 PM
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
## 2018-02-15 NOTE — Progress Notes (Signed)
Pt presents with a flat affect and depressed mood. Pt reports ongoing depression. Pt denies SI/HI. Pt noted to be more focused on pain level during assessment. Pt verbalized that he wasn't started on the pain meds that he was taking at the other hosp before coming here. Writer discussed prescription for Vicodin with MD. Vicodin was then re-ordered for pain. Pt observed ambulating in the hallway with walker. Pt verbalized ongoing left sided weakness. PT came to assess pt this afternoon.  Medications administered as ordered per MD. Verbal support provided. Pt encouraged to attend groups. 15 minute checks performed for safety.
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
## 2018-02-15 NOTE — Progress Notes (Signed)
D: Pt complains of pain.  Pt's wife called and stated that pt called complaining of pain as well as difficulty showering.   Writer contacted extender and was given an order for toradol inj.Writer spoke with pt to encourage pt to ask for help. Informed pt of the "walkin tub" that's available for him to use. Pt has no other questions or concerns.   A:  Support and encouragement was offered. 15 min checks continued for safety.  R: Pt remains safe.
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
## 2018-02-15 NOTE — Progress Notes (Signed)
Recreation Therapy Notes  Date: 3.27.19 Time: 9:30 a.m. Location: [address] Dayroom   Group Topic: Stress Management   Goal Area(s) Addresses:  Goal 1.1: To reduce stress  -Patient will report feeling a reduction in stress level  -Patient will identify the importance of stress management  -Patient will participate during stress management group treatment     Intervention: Stress Management   Activity: Guided Imagery- Patients were in a peaceful environment with soft lighting enhancing patients mood. Patients were read a guided imagery script to help decrease stress levels   Education: Stress Management, Discharge Planning.    Education Outcome: Acknowledges edcuation/In group clarification offered/Needs additional education   Clinical Observations/Feedback:: Patient did not attend     Nathaly Dawkins, Recreation Therapy Intern   Steven Lloyd 02/15/2018 8:28 AM 

## 2018-02-15 NOTE — Progress Notes (Signed)
D: Patient denies current SI, HI or AVH. Patient presents as flat and depressed but otherwise pleasant and appropriate.  Pt. States that he has had a good day, had visitors tonight and has been active in attending groups.  Pt. States that he realizes now that he should've sought help before he got to the point of harming himself stating, "it was poor judgement".  Pt. Inquiring about staple removal, this writer advised that I would have it looked into.  Pt. Is visible on the unit interacting with staff and others.  A: Patient given emotional support from RN. Patient encouraged to come to staff with concerns and/or questions. Patient's medication routine continued. Patient's orders and plan of care reviewed.   R: Patient remains appropriate and cooperative. Will continue to monitor patient q15 minutes for safety.

## 2018-02-16 DIAGNOSIS — F419 Anxiety disorder, unspecified: Secondary | ICD-10-CM

## 2018-02-16 DIAGNOSIS — G4700 Insomnia, unspecified: Secondary | ICD-10-CM

## 2018-02-16 DIAGNOSIS — G8929 Other chronic pain: Secondary | ICD-10-CM

## 2018-02-16 DIAGNOSIS — F332 Major depressive disorder, recurrent severe without psychotic features: Principal | ICD-10-CM

## 2018-02-16 MED ORDER — ASPIRIN 81 MG PO CHEW
CHEWABLE_TABLET | ORAL | Status: AC
  Administered 2018-02-16: 324 mg via ORAL
  Filled 2018-02-16: qty 4

## 2018-02-16 NOTE — [facility] Group Notes (Signed)
[facility] Group Notes:  (Nursing/MHT/Case Management/Adjunct)  Date:  02/16/2018  Time:  4:00 PM  Type of Therapy:  Psychoeducational Skills  Participation Level:  Did Not Attend  Participation Quality:    Affect:    Cognitive:    Insight:    Engagement in Group:    Modes of Intervention:    Summary of Progress/Problems:  Steven Lloyd, Steven Lloyd 02/16/2018, 5:59 PM

## 2018-02-16 NOTE — [facility] Group Notes (Signed)
[facility]  Date/Time  02/16/18 1:15pm  Type of Therapy/Topic:  Group Therapy:  Balance in Life  Participation Level:  Active  Description of Group:    This group will address the concept of balance and how it feels and looks when one is unbalanced. Patients will be encouraged to process areas in their lives that are out of balance, and identify reasons for remaining unbalanced. Facilitators will guide patients utilizing problem- solving interventions to address and correct the stressor making their life unbalanced. Understanding and applying boundaries will be explored and addressed for obtaining  and maintaining a balanced life. Patients will be encouraged to explore ways to assertively make their unbalanced needs known to significant others in their lives, using other group members and facilitator for support and feedback.  Therapeutic Goals: 1. Patient will identify two or more emotions or situations they have that consume much of in their lives. 2. Patient will identify signs/triggers that life has become out of balance:  3. Patient will identify two ways to set boundaries in order to achieve balance in their lives:  4. Patient will demonstrate ability to communicate their needs through discussion and/or role plays  Summary of Patient Progress: Patient attended group and participated despite feeling "off" due to pain medications. Patient stated that his emotional well being and mental health are the areas of his life most out of balance and shared that he has used motivational YouTube videos and positive mantras to help elevate his mood and self-esteem.  Therapeutic Modalities:   Cognitive Behavioral Therapy Solution-Focused Therapy Assertiveness Training
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
## 2018-02-16 NOTE — Progress Notes (Signed)
D:  Patient's self inventory sheet, patient has poor sleep, no sleep medication given  Good appetite, low energy level, good concentration.  Rated depression 6, hopeless and anxiety 5.  Denied withdrawals.  Denied SI.  Physical problems, pain, headaches, legs in a lot of pain.  Worst pain #9 in past 24 hours, feet, legs, neck, head.  Pain medication helpful.  Goal is learn more. Plans observed.  No dsicharge plans. A:  Medications administered per MD orders.  Emotional support and encouragement given patient. R:  Denied SI and HI, contracts for safety.  Denied A/V hallucinations.  Safety maintained with 15 minute checks. OT worked with patient this morning in his room.  Pain and anxiety prn medications given this morning.

## 2018-02-16 NOTE — Progress Notes (Signed)
Alcide CleverKarla Balboni [phone] called and wants to visit patient and bring in 89 yr old child to visit this patient.  AC approved and charge nurse informed.  Charge nurse informed.  Several MHT's informed.  Nurse called mother back and told Mom that she could visit during these hours.  Family member looking forward to visit tonight.
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
## 2018-02-16 NOTE — [facility] Group Notes (Signed)
Adult Psychoeducational Group Note  Date:  02/16/2018 Time:  10:38 PM  Group Topic/Focus:  Wrap-Up Group:   The focus of this group is to help patients review their daily goal of treatment and discuss progress on daily workbooks.  Participation Level:  Active  Participation Quality:  Appropriate and Attentive  Affect:  Appropriate  Cognitive:  Alert and Appropriate  Insight: Appropriate and Good  Engagement in Group:  Engaged  Modes of Intervention:  Discussion and Education  Additional Comments:  Pt attended and participated in wrap up group this evening. Pt had a rough day due to being in pain from being active yesterday. Pt goal was to get out of the bed today and they interacted with the other pt which made for a positive day.   Chrisandra NettersOctavia A Shaney Deckman 02/16/2018, 10:38 PM

## 2018-02-16 NOTE — Plan of Care (Signed)
Nurse discussed depression, anxiety, coping skills with patient.  

## 2018-02-17 ENCOUNTER — Observation Stay (HOSPITAL_COMMUNITY): Payer: No Typology Code available for payment source

## 2018-02-17 LAB — COMPREHENSIVE METABOLIC PANEL
ALBUMIN: 3.5 g/dL (ref 3.5–5.0)
ALK PHOS: 85 U/L (ref 38–126)
ALT: 35 U/L (ref 17–63)
ANION GAP: 9 (ref 5–15)
AST: 59 U/L — ABNORMAL HIGH (ref 15–41)
BUN: 8 mg/dL (ref 6–20)
CALCIUM: 9.3 mg/dL (ref 8.9–10.3)
CO2: 28 mmol/L (ref 22–32)
Chloride: 101 mmol/L (ref 101–111)
Creatinine, Ser: 1.3 mg/dL — ABNORMAL HIGH (ref 0.61–1.24)
GFR calc Af Amer: >60 mL/min (ref >60)
GFR calc non Af Amer: >60 mL/min (ref >60)
GLUCOSE: 101 mg/dL — AB (ref 65–99)
Potassium: 4.3 mmol/L (ref 3.5–5.1)
SODIUM: 138 mmol/L (ref 135–145)
Total Bilirubin: 0.5 mg/dL (ref 0.3–1.2)
Total Protein: 7 g/dL (ref 6.5–8.1)

## 2018-02-17 LAB — CBC WITH DIFFERENTIAL/PLATELET
BASOS PCT: 1 %
Basophils Absolute: 0 10*3/uL (ref 0.0–0.1)
EOS ABS: 0.2 10*3/uL (ref 0.0–0.7)
Eosinophils Relative: 3 %
HCT: 39.9 % (ref 39.0–52.0)
HEMOGLOBIN: 13.2 g/dL (ref 13.0–17.0)
Lymphocytes Relative: 26 %
Lymphs Abs: 1.7 10*3/uL (ref 0.7–4.0)
MCH: 28.1 pg (ref 26.0–34.0)
MCHC: 33.1 g/dL (ref 30.0–36.0)
MCV: 84.9 fL (ref 78.0–100.0)
Monocytes Absolute: 0.7 10*3/uL (ref 0.1–1.0)
Monocytes Relative: 10 %
NEUTROS PCT: 60 %
Neutro Abs: 4 10*3/uL (ref 1.7–7.7)
Platelets: 292 10*3/uL (ref 150–400)
RBC: 4.7 MIL/uL (ref 4.22–5.81)
RDW: 12.5 % (ref 11.5–15.5)
WBC: 6.6 10*3/uL (ref 4.0–10.5)

## 2018-02-17 LAB — I-STAT TROPONIN, ED
Troponin i, poc: 0 ng/mL (ref 0.00–0.08)
Troponin i, poc: 0 ng/mL (ref 0.00–0.08)

## 2018-02-17 LAB — I-STAT BETA HCG BLOOD, ED (MC, WL, AP ONLY)

## 2018-02-17 MED ORDER — HYDROCODONE-ACETAMINOPHEN 5-325 MG PO TABS: 2.0000 | ORAL_TABLET | Freq: Once | ORAL | Status: DC

## 2018-02-17 MED ORDER — ASPIRIN 81 MG PO CHEW
324.0000 mg | CHEWABLE_TABLET | Freq: Once | ORAL | Status: AC
  Administered 2018-02-16: 324 mg via ORAL

## 2018-02-17 MED ORDER — BACITRACIN-NEOMYCIN-POLYMYXIN OINTMENT TUBE
TOPICAL_OINTMENT | Freq: Three times a day (TID) | CUTANEOUS | Status: DC
  Administered 2018-02-17 – 2018-02-23 (×15): via TOPICAL
  Filled 2018-02-17: qty 14.17

## 2018-02-17 MED ORDER — HYDROCODONE-ACETAMINOPHEN 5-325 MG PO TABS
2.0000 | ORAL_TABLET | Freq: Once | ORAL | Status: AC
Start: 2018-02-17 — End: 2018-02-17
  Administered 2018-02-17: 2 via ORAL
  Filled 2018-02-17: qty 2

## 2018-02-17 MED ORDER — METOPROLOL SUCCINATE ER 25 MG PO TB24
25.0000 mg | ORAL_TABLET | Freq: Every day | ORAL | Status: DC
  Administered 2018-02-17 – 2018-02-23 (×7): 25 mg via ORAL
  Filled 2018-02-17 (×3): qty 1
  Filled 2018-02-17: qty 7
  Filled 2018-02-17 (×6): qty 1

## 2018-02-17 NOTE — Progress Notes (Signed)
PT Cancellation Note  Patient Details Name: Steven Lloyd MRN: [id number] DOB: Mar 08, 1976   Cancelled Treatment:    Reason Eval/Treat Not Completed: Medical issues which prohibited therapy. OT spoke with RN who recommends low activity today as patient had gone to ED with CP yesterday. Will check back another day.    Rada HayHill, Sarahanne Novakowski Elizabeth 02/17/2018, 2:02 PM Blanchard KelchKaren Ottilie Wigglesworth PT [phone]

## 2018-02-17 NOTE — Plan of Care (Signed)
  Problem: Safety: Goal: Periods of time without injury will increase Outcome: Progressing Note:  Pt safe on the unit at this time    

## 2018-02-17 NOTE — ED Notes (Signed)
[facility] transport notified to transport pt. back to behavior health adult unit [id number] .

## 2018-02-17 NOTE — Discharge Instructions (Addendum)
You have had 2 normal sets of cardiac labs, normal EKG and normal chest x-ray.  Information on my medicine - XARELTO (rivaroxaban)  This medication education was reviewed with me or my healthcare representative as part of my discharge preparation.  The pharmacist that spoke with me during my hospital stay was:  WOFFORD, DREW A, RPH  WHY WAS XARELTO PRESCRIBED FOR YOU? Xarelto was prescribed to treat blood clots that may have been found in the veins of your legs (deep vein thrombosis) or in your lungs (pulmonary embolism) and to reduce the risk of them occurring again.  WHAT DO YOU NEED TO KNOW ABOUT XARELTO? The starting dose is one 15 mg tablet taken TWICE daily with food for the FIRST 21 DAYS then on (enter date)  03/16/18 the dose is changed to one 20 mg tablet taken ONCE A DAY with your evening meal.  DO NOT stop taking Xarelto without talking to the health care provider who prescribed the medication.  Refill your prescription for 20 mg tablets before you run out.  After discharge, you should have regular check-up appointments with your healthcare provider that is prescribing your Xarelto.  In the future your dose may need to be changed if your kidney function changes by a significant amount.  WHAT DO YOU DO IF YOU MISS A DOSE? If you are taking Xarelto TWICE DAILY and you miss a dose, take it as soon as you remember. You may take two 15 mg tablets (total 30 mg) at the same time then resume your regularly scheduled 15 mg twice daily the next day.  If you are taking Xarelto ONCE DAILY and you miss a dose, take it as soon as you remember on the same day then continue your regularly scheduled once daily regimen the next day. Do not take two doses of Xarelto at the same time.   IMPORTANT SAFETY INFORMATION Xarelto is a blood thinner medicine that can cause bleeding. You should call your healthcare provider right away if you experience any of the following: Bleeding from an injury or  your nose that does not stop. Unusual colored urine (red or dark brown) or unusual colored stools (red or black). Unusual bruising for unknown reasons. A serious fall or if you hit your head (even if there is no bleeding).  Some medicines may interact with Xarelto and might increase your risk of bleeding while on Xarelto. To help avoid this, consult your healthcare provider or pharmacist prior to using any new prescription or non-prescription medications, including herbals, vitamins, non-steroidal anti-inflammatory drugs (NSAIDs) and supplements.  This website has more information on Xarelto: VisitDestination.com.[email or web address].xarelto.com.

## 2018-02-17 NOTE — Progress Notes (Signed)
D: Pt denies SI/HI/AVH. Pt is pleasant and cooperative. Pt stated he was doing better this evening . Pt said he had a bad game. Pt visible on the unit this evening  A: Pt was offered support and encouragement. Pt was given scheduled medications. Pt was encourage to attend groups. Q 15 minute checks were done for safety.    R:Pt attends groups and interacts well with peers and staff. Pt is taking medication. Pt receptive to treatment and safety maintained on unit.
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
## 2018-02-17 NOTE — ED Notes (Signed)
Patient evaluated by Dr. Elesa MassedWard and advised RN that pt. can return to [facility] .

## 2018-02-17 NOTE — ED Notes (Signed)
[facility] here to transport pt, voluntary consent signed and pt transported out.
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
## 2018-02-17 NOTE — ED Triage Notes (Signed)
[facility] reports central chest pain this evening radiating to  Both shoulders and neck , he received 1 NTG sl and 324 mg ASA by EMS with relief , denies chest pain at arrival .
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
## 2018-02-17 NOTE — ED Notes (Signed)
Report given to Bonita QuinLinda RN at behavior health  on pt.'s condition and [facility] .

## 2018-02-17 NOTE — BHH Group Notes (Signed)
LCSW Group Therapy Note 02/17/2018 2:17 PM  Type of Therapy and Topic: Group Therapy: Avoiding Self-Sabotaging and Enabling Behaviors  Participation Level: Did Not Attend  Description of Group:  In this group, patients will learn how to identify obstacles, self-sabotaging and enabling behaviors, as well as: what are they, why do we do them and what needs these behaviors meet. Discuss unhealthy relationships and how to have positive healthy boundaries with those that sabotage and enable. Explore aspects of self-sabotage and enabling in yourself and how to limit these self-destructive behaviors in everyday life.  Therapeutic Goals: 1. Patient will identify one obstacle that relates to self-sabotage and enabling behaviors 2. Patient will identify one personal self-sabotaging or enabling behavior they did prior to admission 3. Patient will state a plan to change the above identified behavior 4. Patient will demonstrate ability to communicate their needs through discussion and/or role play.   Summary of Patient Progress:  INVITED,CHOSE NOT TO ATTEND.    Therapeutic Modalities:  Cognitive Behavioral Therapy Person-Centered Therapy Motivational Interviewing   Baldo DaubJolan Fontaine Hehl LCSWA Clinical Social Worker

## 2018-02-18 MED ORDER — GABAPENTIN 300 MG PO CAPS
300.0000 mg | ORAL_CAPSULE | Freq: Three times a day (TID) | ORAL | Status: DC
  Administered 2018-02-18 – 2018-02-23 (×15): 300 mg via ORAL
  Filled 2018-02-18: qty 21
  Filled 2018-02-18 (×5): qty 1
  Filled 2018-02-18 (×2): qty 21
  Filled 2018-02-18 (×13): qty 1

## 2018-02-18 MED ORDER — BACLOFEN 5 MG HALF TABLET
5.0000 mg | ORAL_TABLET | Freq: Three times a day (TID) | ORAL | Status: DC
  Filled 2018-02-18 (×2): qty 1

## 2018-02-18 MED ORDER — DICLOFENAC SODIUM 1 % TD GEL
2.0000 g | Freq: Four times a day (QID) | TRANSDERMAL | Status: DC
  Administered 2018-02-18 – 2018-02-23 (×15): 2 g via TOPICAL
  Filled 2018-02-18 (×2): qty 100

## 2018-02-18 MED ORDER — FLUOXETINE HCL 20 MG PO CAPS
20.0000 mg | ORAL_CAPSULE | Freq: Every day | ORAL | Status: DC
  Administered 2018-02-19 – 2018-02-23 (×5): 20 mg via ORAL
  Filled 2018-02-18: qty 7
  Filled 2018-02-18 (×7): qty 1

## 2018-02-18 MED ORDER — BACLOFEN 10 MG PO TABS
5.0000 mg | ORAL_TABLET | Freq: Three times a day (TID) | ORAL | Status: DC
  Administered 2018-02-18 – 2018-02-23 (×15): 5 mg via ORAL
  Filled 2018-02-18: qty 0.5
  Filled 2018-02-18: qty 11
  Filled 2018-02-18 (×5): qty 0.5
  Filled 2018-02-18: qty 11
  Filled 2018-02-18 (×4): qty 0.5
  Filled 2018-02-18: qty 11
  Filled 2018-02-18 (×2): qty 0.5
  Filled 2018-02-18 (×2): qty 1
  Filled 2018-02-18 (×6): qty 0.5

## 2018-02-18 MED ORDER — HYDROCODONE-ACETAMINOPHEN 5-325 MG PO TABS
2.0000 | ORAL_TABLET | Freq: Four times a day (QID) | ORAL | Status: DC | PRN
  Administered 2018-02-18 – 2018-02-22 (×12): 2 via ORAL
  Filled 2018-02-18 (×12): qty 2

## 2018-02-18 MED ORDER — MAGNESIUM SULFATE (LAXATIVE) PO GRAN
10.0000 g | GRANULES | ORAL | Status: DC | PRN
  Administered 2018-02-19: 10 g
  Filled 2018-02-18 (×2): qty 454

## 2018-02-18 MED ORDER — HYDROCODONE-ACETAMINOPHEN 5-325 MG PO TABS
1.0000 | ORAL_TABLET | Freq: Four times a day (QID) | ORAL | Status: DC | PRN
  Filled 2018-02-18: qty 2
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
## 2018-02-18 NOTE — Progress Notes (Signed)
Adult Psychoeducational Group Note  Date:  02/18/2018 Time:  4:10 AM  Group Topic/Focus:  Wrap-Up Group:   The focus of this group is to help patients review their daily goal of treatment and discuss progress on daily workbooks.  Participation Level:  Active  Participation Quality:  Appropriate  Affect:  Appropriate  Cognitive:  Appropriate  Insight: Appropriate  Engagement in Group:  Engaged  Modes of Intervention:  Discussion  Additional Comments: Pt stated his goal for today was to talk with his doctor about his pain issues. Pt stated he accomplished this goal today and his pain has declined. Pt stated he attend all groups held. Pt stated his family visiting today and calling him on the phone help improve his overall day.  Felipa FurnaceChristopher  Lovinia Snare 02/18/2018, 4:10 AM
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
## 2018-02-18 NOTE — Progress Notes (Signed)
Adult Psychoeducational Group Note  Date:  02/18/2018 Time:  9:53 AM  Group Topic/Focus:  Goals Group:   The focus of this group is to help patients establish daily goals to achieve during treatment and discuss how the patient can incorporate goal setting into their daily lives to aide in recovery.  Participation Level:  Active  Participation Quality:  Appropriate  Affect:  Appropriate  Cognitive:  Appropriate  Insight: Appropriate  Engagement in Group:  Engaged  Modes of Intervention:  Rapport Building  Additional Comments:  Pt was appropriate and active in group.   Graceann CongressWollie, Tanecia Mccay Celcia 02/18/2018, 9:53 AM

## 2018-02-18 NOTE — B[facility] Group Notes (Signed)
LCSW Group Therapy Note  02/18/2018   9:30-10:30am (300 hall)                10:30-11:30am (400 hall)                11:30am-12:00pm (500 hall)  Type of Therapy and Topic:  Group Therapy: Anger Cues and Responses  Participation Level:  Active   Description of Group:   In this group, patients learned how to recognize the physical, cognitive, emotional, and behavioral responses they have to anger-provoking situations.  They identified a recent time they became angry and how they reacted.  They analyzed how their reaction was possibly beneficial and how it was possibly unhelpful.  The group discussed a variety of healthier coping skills that could help with such a situation in the future.  Deep breathing was practiced briefly.  Therapeutic Goals: 1. Patients will remember their last incident of anger and how they felt emotionally and physically, what their thoughts were at the time, and how they behaved. 2. Patients will identify how their behavior at that time worked for them, as well as how it worked against them. 3. Patients will explore possible new behaviors to use in future anger situations. 4. Patients will learn that anger itself is normal and cannot be eliminated, and that healthier reactions can assist with resolving conflict rather than worsening situations.  Summary of Patient Progress:  The patient shared that his most recent time of anger was 1 week ago and said he was angry at himself and tired of life so became suicidal.  He spoke a lot throughout group and was honest about his hopeless feelings.  Therapeutic Modalities:   Cognitive Behavioral Therapy  Lynnell ChadMareida J Grossman-Orr  02/18/2018 12:00pm

## 2018-02-18 NOTE — Progress Notes (Signed)
Pt was asking for his other pain pill. Pt stated if he only took 1 pill he could get another one later if he needed it. Pt was informed that the order did not have any specific instructions on when to give 1 or 2 pills , so PA on duty adjusted order for 1 pill moderate pain < 7 and 2 pills severe pain  7 or > .

## 2018-02-18 NOTE — Progress Notes (Signed)
Pt woke up in pain and was given 600 mg ibuprofen 0129 and pt stated he wanted us to wake him up to get his Norco at 0305 . Pt was informed that waking him up to give him a pain pill defeats the purpose, due to the fact if he was sleep he was not feeling pain. Pt was informed if he got up after 0305 and was in pain he could get his Norco.
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
## 2018-02-19 NOTE — [facility] Group Notes (Signed)
[facility]  Date/Time:  02/19/2018 10AM  Type of Therapy and Topic:  Group Therapy:  Healthy and Unhealthy Supports  Participation Level:  Active   Description of Group:  Patients in this group were introduced to the idea of adding a variety of healthy supports to address the various needs in their lives.Patients discussed what additional healthy supports could be helpful in their recovery and wellness after discharge in order to prevent future hospitalizations.   An emphasis was placed on using counselor, doctor, therapy groups, 12-step groups, and problem-specific support groups to expand supports.  They also worked as a group on developing a specific plan for several patients to deal with unhealthy supports through boundary-setting, psychoeducation with loved ones, and even termination of relationships.   Therapeutic Goals:              1)  discuss importance of adding supports to stay well once out of the hospital             2)  compare healthy versus unhealthy supports and identify some examples of each             3)  generate ideas and descriptions of healthy supports that can be added             4)  offer mutual support about how to address unhealthy supports             5)  encourage active participation in and adherence to discharge plan               Summary of Patient Progress:   Pt continues to work towards their tx goals but has not yet reached them. Pt was able to appropriately participate in group discussion, and was able to offer support/validation to other group members. Pt reported feeling, "good today. The pain meds are kicking in." Pt reported feeling that, "supports are really important." Pt stated, "My only real support is boxing. It keeps me from doing something aggressive in a negative way." Pt reported, "I think I need to find better supports because a lot of people just encourage my negative thoughts and behaviors." Pt reported one way he can maintain  better supports in the community is to, "stay away from negative people. I just won't even entertain them."  Therapeutic Modalities:   Motivational Interviewing Brief Solution-Focused Therapy  Heidi DachKelsey Jo Cerone, MSW, LCSW 02/19/2018 10:57 AM

## 2018-02-19 NOTE — Progress Notes (Signed)
D:  Patient's self inventory sheet, patient sleeps good, sleep medication helpful.  Good appetite, normal energy level, good concentration.  Rated depression, anxiety, hopeless #1.  Denied withdrawals.  Denied SI.  Physical problems, headaches.  Worst pain in past 24 hours is 5-6, head, leg, neck, feet, back.  Pain medication helpful.   Goal is happiness, gratitude, prosperity, love.  Plans to do "all that God will allow me to do!"  Would like massage therapist to [facility].  Would help in amazing ways.  No discharge plans. A:  Medications administered per MD orders.  Emotional support and encouragement given patient. R:  Patient denied SI and HI, contracts for safety.  Denied A/V hallucinations.  Patient has used wheelchair in hallway this morning.   Patient is determined to turn his life around.  Loves his children and his wife.

## 2018-02-19 NOTE — Progress Notes (Signed)
Adult Psychoeducational Group Note  Date:  02/19/2018 Time:  2:23 AM  Group Topic/Focus:  Wrap-Up Group:   The focus of this group is to help patients review their daily goal of treatment and discuss progress on daily workbooks.  Participation Level:  Active  Participation Quality:  Appropriate  Affect:  Appropriate  Cognitive:  Appropriate  Insight: Appropriate  Engagement in Group:  Engaged  Modes of Intervention:  Discussion  Additional Comments:  Pt stated is goal for today was to talk with doctor about his medication issue. Pt felt he only needed one dose of Trazodone instead of the two he received last night. Pt felt like he accomplished his goal today. Pt rated his overall day a 10. Pt stated he attend all groups held today. Pt stated the visitation by his family today help improve his day.  Steven FurnaceChristopher  [name] 02/19/2018, 2:23 AM
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
## 2018-02-19 NOTE — [facility] Group Notes (Signed)
[facility] Group Notes:  (Nursing)  Date:  02/19/2018  Time: 1:15 PM Type of Therapy:  Nurse Education  Participation Level:  Active  Participation Quality:  Appropriate and Attentive  Affect:  Appropriate  Cognitive:  Alert and Appropriate  Insight:  Good  Engagement in Group:  Engaged  Modes of Intervention:  Discussion and Education  Summary of Progress/Problems: Patient was actively engaged - sharing his coping skills with the group.   Shela NevinValerie S Trellis Vanoverbeke 02/19/2018, 2:50 PM

## 2018-02-19 NOTE — Progress Notes (Signed)
Adult Psychoeducational Group Note  Date:  02/19/2018 Time:  9:07 PM  Group Topic/Focus:  Wrap-Up Group:   The focus of this group is to help patients review their daily goal of treatment and discuss progress on daily workbooks.  Participation Level:  Active  Participation Quality:  Appropriate  Affect:  Appropriate  Cognitive:  Alert and Oriented  Insight: Good  Engagement in Group:  Engaged  Modes of Intervention:  Discussion  Additional Comments:  Pt attended group this evening.  Steven GrosserMegan A Dejan Angert 02/19/2018, 9:07 PM

## 2018-02-19 NOTE — Plan of Care (Signed)
Nurse discussed depression, anxiety, coping skills with patient.  
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
## 2018-02-20 NOTE — Progress Notes (Signed)
Pt presents with an animated affect and anxious mood. Pt noted to be fidgety throughout the morning. Pt reports decreased depression today. Pt verbalized that he's in a good place this morning and have learned how valuable his life is. Pt stated that he has a good support system and plans to utilize his family in the event of a crisis. Pt stated that there are some things that he needs to take care of today so that he's prepared to discharge tomorrow. Pt encouraged to speak with MD regarding d/c plans. Pt denies SI/HI. Medications reviewed with pt. Verbal support provided. Pt encouraged to attend groups. 15 minute checks performed for safety. Pt compliant with tx plan.

## 2018-02-20 NOTE — Tx Team (Signed)
Interdisciplinary Treatment and Diagnostic Plan Update  02/15/2018 Time of Session: 9:45am KOREN SERMERSHEIM MRN: [id number]  Principal Diagnosis: MDD (major depressive disorder), recurrent episode, severe (HCC)  Secondary Diagnoses: Principal Problem:   MDD (major depressive disorder), recurrent episode, severe (HCC)   Current Medications:  Current Facility-Administered Medications  Medication Dose Route Frequency Provider Last Rate Last Dose  . alum & mag hydroxide-simeth (MAALOX/MYLANTA) 200-200-20 MG/5ML suspension 30 mL  30 mL Oral Q4H PRN Donell Sievert E, PA-C      . baclofen (LIORESAL) tablet 5 mg  5 mg Oral TID Cobos, Rockey Situ, MD   5 mg at 02/20/18 0813  . diclofenac sodium (VOLTAREN) 1 % transdermal gel 2 g  2 g Topical QID Antonieta Pert, MD   2 g at 02/20/18 0815  . FLUoxetine (PROZAC) capsule 20 mg  20 mg Oral Daily Antonieta Pert, MD   20 mg at 02/20/18 0813  . gabapentin (NEURONTIN) capsule 300 mg  300 mg Oral TID Antonieta Pert, MD   300 mg at 02/20/18 0813  . HYDROcodone-acetaminophen (NORCO/VICODIN) 5-325 MG per tablet 2 tablet  2 tablet Oral Q6H PRN Antonieta Pert, MD   2 tablet at 02/20/18 0813  . hydrOXYzine (ATARAX/VISTARIL) tablet 25 mg  25 mg Oral Q6H PRN Kerry Hough, PA-C   25 mg at 02/20/18 0119  . ibuprofen (ADVIL,MOTRIN) tablet 600 mg  600 mg Oral Q6H PRN Kerry Hough, PA-C   600 mg at 02/20/18 0119  . levETIRAcetam (KEPPRA) tablet 500 mg  500 mg Oral BID Okonkwo, Justina A, NP   500 mg at 02/20/18 0813  . magnesium hydroxide (MILK OF MAGNESIA) suspension 30 mL  30 mL Oral Daily PRN Donell Sievert E, PA-C      . magnesium sulfate (EPSOM SALT) granules 10 g  10 g Other PRN Antonieta Pert, MD   10 g at 02/19/18 2230  . metoprolol succinate (TOPROL-XL) 24 hr tablet 25 mg  25 mg Oral Daily Antonieta Pert, MD   25 mg at 02/20/18 0813  . neomycin-bacitracin-polymyxin (NEOSPORIN) ointment   Topical TID Antonieta Pert, MD      .  pantoprazole (PROTONIX) EC tablet 40 mg  40 mg Oral Daily Okonkwo, Justina A, NP   40 mg at 02/20/18 0813  . promethazine (PHENERGAN) tablet 12.5-25 mg  12.5-25 mg Oral Q4H PRN Okonkwo, Justina A, NP      . tamsulosin (FLOMAX) capsule 0.4 mg  0.4 mg Oral Daily Okonkwo, Justina A, NP   0.4 mg at 02/20/18 0817  . traZODone (DESYREL) tablet 50 mg  50 mg Oral QHS,MR X 1 Kerry Hough, PA-C   50 mg at 02/19/18 2253   PTA Medications: Medications Prior to Admission  Medication Sig Dispense Refill Last Dose  . acetaminophen (TYLENOL) 325 MG tablet Take 1 tablet (325 mg total) by mouth every 6 (six) hours as needed for mild pain (temp > 100.5).     Marland Kitchen HYDROcodone-acetaminophen (NORCO/VICODIN) 5-325 MG tablet Take 1 tablet by mouth every 6 (six) hours as needed for moderate pain. 20 tablet 0   . levETIRAcetam (KEPPRA) 500 MG tablet Take 1 tablet (500 mg total) by mouth 2 (two) times daily for 14 days.     . ondansetron (ZOFRAN-ODT) 4 MG disintegrating tablet Take 1 tablet (4 mg total) by mouth every 6 (six) hours as needed for nausea. 20 tablet 0     Patient Stressors: Marital or family conflict Substance  abuse  Patient Strengths: Ability for insight Average or above average intelligence Capable of independent living General fund of knowledge Motivation for treatment/growth  Treatment Modalities: Medication Management, Group therapy, Case management,  1 to 1 session with clinician, Psychoeducation, Recreational therapy.   Physician Treatment Plan for Primary Diagnosis: MDD (major depressive disorder), recurrent episode, severe (HCC) Long Term Goal(s): Improvement in symptoms so as ready for discharge Improvement in symptoms so as ready for discharge   Short Term Goals: Ability to identify changes in lifestyle to reduce recurrence of condition will improve Ability to verbalize feelings will improve Ability to disclose and discuss suicidal ideas Ability to demonstrate self-control will  improve Ability to identify and develop effective coping behaviors will improve Ability to maintain clinical measurements within normal limits will improve Compliance with prescribed medications will improve Ability to identify triggers associated with substance abuse/mental health issues will improve Ability to identify changes in lifestyle to reduce recurrence of condition will improve Ability to verbalize feelings will improve Ability to disclose and discuss suicidal ideas Ability to demonstrate self-control will improve Ability to identify and develop effective coping behaviors will improve Ability to maintain clinical measurements within normal limits will improve Compliance with prescribed medications will improve  Medication Management: Evaluate patient's response, side effects, and tolerance of medication regimen.  Therapeutic Interventions: 1 to 1 sessions, Unit Group sessions and Medication administration.  Evaluation of Outcomes: Progressing  Physician Treatment Plan for Secondary Diagnosis: Principal Problem:   MDD (major depressive disorder), recurrent episode, severe (HCC)  Long Term Goal(s): Improvement in symptoms so as ready for discharge Improvement in symptoms so as ready for discharge   Short Term Goals: Ability to identify changes in lifestyle to reduce recurrence of condition will improve Ability to verbalize feelings will improve Ability to disclose and discuss suicidal ideas Ability to demonstrate self-control will improve Ability to identify and develop effective coping behaviors will improve Ability to maintain clinical measurements within normal limits will improve Compliance with prescribed medications will improve Ability to identify triggers associated with substance abuse/mental health issues will improve Ability to identify changes in lifestyle to reduce recurrence of condition will improve Ability to verbalize feelings will improve Ability to  disclose and discuss suicidal ideas Ability to demonstrate self-control will improve Ability to identify and develop effective coping behaviors will improve Ability to maintain clinical measurements within normal limits will improve Compliance with prescribed medications will improve     Medication Management: Evaluate patient's response, side effects, and tolerance of medication regimen.  Therapeutic Interventions: 1 to 1 sessions, Unit Group sessions and Medication administration.  Evaluation of Outcomes: Progressing   RN Treatment Plan for Primary Diagnosis: MDD (major depressive disorder), recurrent episode, severe (HCC) Long Term Goal(s): Knowledge of disease and therapeutic regimen to maintain health will improve  Short Term Goals: Ability to remain free from injury will improve, Ability to verbalize feelings will improve, Ability to disclose and discuss suicidal ideas and Compliance with prescribed medications will improve  Medication Management: RN will administer medications as ordered by provider, will assess and evaluate patient's response and provide education to patient for prescribed medication. RN will report any adverse and/or side effects to prescribing provider.  Therapeutic Interventions: 1 on 1 counseling sessions, Psychoeducation, Medication administration, Evaluate responses to treatment, Monitor vital signs and CBGs as ordered, Perform/monitor CIWA, COWS, AIMS and Fall Risk screenings as ordered, Perform wound care treatments as ordered.  Evaluation of Outcomes: Progressing   LCSW Treatment Plan for Primary Diagnosis: MDD (major  depressive disorder), recurrent episode, severe (HCC) Long Term Goal(s): Safe transition to appropriate next level of care at discharge, Engage patient in therapeutic group addressing interpersonal concerns.  Short Term Goals: Engage patient in aftercare planning with referrals and resources, Increase social support, Increase emotional  regulation and Increase skills for wellness and recovery  Therapeutic Interventions: Assess for all discharge needs, 1 to 1 time with Social worker, Explore available resources and support systems, Assess for adequacy in community support network, Educate family and significant other(s) on suicide prevention, Complete Psychosocial Assessment, Interpersonal group therapy.  Evaluation of Outcomes: Progressing   Progress in Treatment: Attending groups: Yes. Participating in groups: Yes. Taking medication as prescribed: Yes. Toleration medication: Yes. Family/Significant other contact made: Yes, individual(s) contacted:  wife Patient understands diagnosis: Yes. Discussing patient identified problems/goals with staff: Yes. Medical problems stabilized or resolved: Yes. Being treated for head wound and pain management. Denies suicidal/homicidal ideation: Yes.  Issues/concerns per patient self-inventory: No.  New problem(s) identified: No, Describe:  CSW continuing to assess  New Short Term/Long Term Goal(s): Patient's goal "get better."  Discharge Plan or Barriers: [facility]  Reason for Continuation of Hospitalization: Anxiety Depression Medication stabilization Suicidal ideation  Estimated Length of Stay: Continuing to assess  Attendees: Patient: 02/20/2018   Physician: Dr Jola Babinski, MD 02/20/2018   Nursing: Liborio Nixon, RN 02/20/2018   RN Care Manager: 02/20/2018   Social Worker: Daleen Squibb, LCSW 02/20/2018   Recreational Therapist:  02/20/2018   Other:  02/20/2018   Other:  02/20/2018   Other: 02/20/2018        Scribe for Treatment Team: Daleen Squibb, LCSW 02/20/2018 11:49 AM

## 2018-02-20 NOTE — Progress Notes (Signed)
Adult Psychoeducational Group Note  Date:  02/20/2018 Time:  9:25 PM  Group Topic/Focus:  Wrap-Up Group:   The focus of this group is to help patients review their daily goal of treatment and discuss progress on daily workbooks.  Participation Level:  Active  Participation Quality:  Appropriate  Affect:  Appropriate  Cognitive:  Alert  Insight: Appropriate  Engagement in Group:  Engaged  Modes of Intervention:  Discussion  Additional Comments: Patient stated having an magnificent/marvelous day. Patient's goal for today was to feel better. Patient met goal. Patient stated he will be discharging tomorrow.   Steven Lloyd L Kalden Wanke 02/20/2018, 9:25 PM

## 2018-02-20 NOTE — Progress Notes (Signed)
Recreation Therapy Notes  Date: 4.1.19 Time: 9:30 a.m. Location: [address] Dayroom   Group Topic: Stress Management   Goal Area(s) Addresses:  Goal 1.1: To reduce stress  -Patient will report feeling a reduction in stress level  -Patient will identify the importance of stress management  -Patient will participate during stress management group treatment     Behavioral Response: Engaged   Intervention: Stress Management   Activity: Guided Imagery- Patients were in a peaceful environment with soft lighting enhancing patients mood. Patients were read a guided imagery script to help decrease stress levels   Education: Stress Management, Discharge Planning.    Education Outcome: Acknowledges edcuation/In group clarification offered/Needs additional education   Clinical Observations/Feedback:: Patient attended and participated appropriately during stress management group treatment. Patient reported feeling a reduction in stress level.    Sheryle Hailarian Lauren Modisette, Recreation Therapy Intern   Sheryle HailDarian Dannis Deroche 02/20/2018 9:14 AM

## 2018-02-20 NOTE — Progress Notes (Signed)
PT Cancellation Note  Patient Details Name: Karl LukeJames C Denbow MRN: [id number] DOB: [birth year]   Cancelled Treatment:     Observed patient ambulating on hall. Currently has gone to gym. Will check back another time.  Patient will benefit from Neurorehabilitation as outpatient.Rada Hay.    Jacqueli Pangallo Elizabeth 02/20/2018, 11:39 AM  Blanchard KelchKaren Anson Peddie PT [phone]

## 2018-02-20 NOTE — Progress Notes (Signed)
Adult Psychoeducational Group Note  Date:  02/20/2018 Time:  7:12 PM  Group Topic/Focus:  Goals Group:   The focus of this group is to help patients establish daily goals to achieve during treatment and discuss how the patient can incorporate goal setting into their daily lives to aide in recovery.  Participation Level:  Active  Participation Quality:  Appropriate  Affect:  Appropriate  Cognitive:  Alert  Insight: Appropriate  Engagement in Group:  Engaged  Modes of Intervention:  Discussion  Additional Comments:  Pt attended group and participated in all discussions.  Karthika Glasper R Jaimen Melone 02/20/2018, 7:12 PM

## 2018-02-20 NOTE — Progress Notes (Signed)
Patient ID: Steven Lloyd, male   DOB: 07/14/1976, 42 y.o.   MRN: [id number]  Pt currently presents with a flat affect and anxious behavior. Pt speech is rapid and pressured, content is tangential and preoccupied with childrens mother. Pt reports to writer that their goal is to "maybe leave on Monday after I get to meet this guy that's coming to talk." Pt states multiple times about how he feels as if he has been enlightened, that he knows that he is on this earth from God and for a purpose. Pt remains on phone with childrens mother tonight for over 30 minutes. Pt talks about how he has to focus on himself but then ruminates about what she thinks of him and how she speaks of him to others. Pt reports good sleep with current medication regimen. Pt reports ongoing pain to writer, reports he is trying to not take so much pain medication anymore. Pt has new onset non pitting edema in his medial L ankle tonight.   Pt provided with medications per providers orders. Pt's labs and vitals were monitored throughout the night. Pt given a 1:1 about emotional and mental status. Pt supported and encouraged to express concerns and questions. Pt educated on medications and assertiveness techniques, needs reinforcement.   Pt's safety ensured with 15 minute and environmental checks. Pt currently denies SI/HI and A/V hallucinations. Pt verbally agrees to seek staff if SI/HI or A/VH occurs and to consult with staff before acting on any harmful thoughts. L ankle edema resolves with elevation, will pass onto oncoming RN. Head wound assessed. No drainage or bleeding noted. Will continue POC.
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
## 2018-02-20 NOTE — [facility] Group Notes (Signed)
[facility] LCSW Group Therapy Note  Date/Time: 02/20/18, 1315  Type of Therapy and Topic:  Group Therapy:  Overcoming Obstacles  Participation Level:  active  Description of Group:    In this group patients will be encouraged to explore what they see as obstacles to their own wellness and recovery. They will be guided to discuss their thoughts, feelings, and behaviors related to these obstacles. The group will process together ways to cope with barriers, with attention given to specific choices patients can make. Each patient will be challenged to identify changes they are motivated to make in order to overcome their obstacles. This group will be process-oriented, with patients participating in exploration of their own experiences as well as giving and receiving support and challenge from other group members.  Therapeutic Goals: 1. Patient will identify personal and current obstacles as they relate to admission. 2. Patient will identify barriers that currently interfere with their wellness or overcoming obstacles.  3. Patient will identify feelings, thought process and behaviors related to these barriers. 4. Patient will identify two changes they are willing to make to overcome these obstacles:    Summary of Patient Progress: Pt shared that he has had multiple obstacles recently:  Depression, loss of relationship, legal, health, substance use.  Pt was very active during group discussion about ways to work to overcome obstacles in life.      Therapeutic Modalities:   Cognitive Behavioral Therapy Solution Focused Therapy Motivational Interviewing Relapse Prevention Therapy  Daleen SquibbGreg Affan Callow, LCSW

## 2018-02-20 NOTE — [facility] Group Notes (Signed)
[facility] Group Notes:  (Nursing/MHT/Case Management/Adjunct)  Date:  02/20/2018  Time:  4:00 pm  Type of Therapy:  Psychoeducational Skills  Participation Level:  Active  Participation Quality:  Appropriate  Affect:  Appropriate  Cognitive:  Appropriate  Insight:  Appropriate  Engagement in Group:  Engaged  Modes of Intervention:  Education  Summary of Progress/Problems:   Patient was alert and participated in group.  Earline MayotteKnight, Tyger Oka Shephard 02/20/2018, 6:17 PM

## 2018-02-21 DIAGNOSIS — F410 Panic disorder [episodic paroxysmal anxiety] without agoraphobia: Secondary | ICD-10-CM

## 2018-02-21 DIAGNOSIS — F1290 Cannabis use, unspecified, uncomplicated: Secondary | ICD-10-CM

## 2018-02-21 NOTE — [facility] Group Notes (Signed)
Adult Psychoeducational Group Note  Date:  02/21/2018 Time:  9:38 AM  Group Topic/Focus:  Goals Group:   The focus of this group is to help patients establish daily goals to achieve during treatment and discuss how the patient can incorporate goal setting into their daily lives to aide in recovery.  Participation Level:  Active  Participation Quality:  Appropriate  Affect:  Appropriate  Cognitive:  Alert  Insight: Appropriate  Engagement in Group:  Engaged  Modes of Intervention:  Orientation  Additional Comments:  Pt participated in orientation/goals group. Pt goal for today is to keep his energy at a high and do some important things once he's discharged from the hospital.  Dellia NimsJaquesha M Arless Vineyard 02/21/2018, 9:38 AM

## 2018-02-21 NOTE — Progress Notes (Signed)
Patient ID: Steven Lloyd, male   DOB: 14-Dec-1975, 42 y.o.   MRN: [id number]  Pt currently presents with an anxious affect and assertive behavior. Pt interacts positively with peers tonight. Reports a good visit with his ex. Pt seen on the phone most of the night tonight.  Pt provided with medications per providers orders. Pt's labs and vitals were monitored throughout the night. Pt given a 1:1 about emotional and mental status. Pt supported and encouraged to express concerns and questions. Pt educated on medications.  Pt's safety ensured with 15 minute and environmental checks. Pt currently denies SI/HI and A/V hallucinations. Pt verbally agrees to seek staff if SI/HI or A/VH occurs and to consult with staff before acting on any harmful thoughts. Will continue POC.

## 2018-02-21 NOTE — Progress Notes (Signed)
Adult Psychoeducational Group Note  Date:  02/21/2018 Time:  9:59 PM  Group Topic/Focus:  Wrap-Up Group:   The focus of this group is to help patients review their daily goal of treatment and discuss progress on daily workbooks.  Participation Level:  Active  Participation Quality:  Appropriate  Affect:  Appropriate  Cognitive:  Appropriate  Insight: Appropriate  Engagement in Group:  Engaged  Modes of Intervention:  Discussion  Additional Comments:  Patient attended group and said that his day was a 10. His coping skills were socializing,writing and talking to his Mom on the phone.   Citlaly Camplin W Drevin Ortner 02/21/2018, 9:59 PM

## 2018-02-21 NOTE — Progress Notes (Signed)
Patient ID: Steven Lloyd, male   DOB: 08/31/1976, 42 y.o.   MRN: [id number]  Pt presses call bell to receive his pain medication tonight. Pt assisted to restroom. Pt ruminates about childrens mother, states "She needs to be on medications, she needs to be here, I love her and I am going to help her." Mentions his social worker setting up a family session with her. While Clinical research associatewriter was gathering as needed pain medication pt approaches nurses station. Requests to call his children's mother. [facility] to which patient becomes agitated. Notified that he can call her at 6 when the day room opens. Pt then refuses to speak to writer or rate his pain medication on a scale 0 - 10. Pt given crackers to take with medications, returns to room. In no current distress.

## 2018-02-21 NOTE — Progress Notes (Signed)
D:  Steven Lloyd has been up and visible on the unit.  He has been pleasant and cooperative.  He denies any SI/HI or A/V hallucinations.  He reported that he was excited because he believes that he is going home today.  He completed his self inventory and reported his depression, hopelessness and anxiety are 0/10.  He stated his goal for today was "going home in a positive mental attitude" and he will accomplish this goal by "think big and help others around me in anyway I can."  He did complain of left swollen ankle and encouraged him to talk with the doctor.  He continues to voice having some dizziness but has been moving well on the unit.   A:  1:1 with RN for support and encouragement.  Medications as ordered.  Q 15 minute checks maintained for safety.  Encouraged participation in group and unit activities. R:  Steven Lloyd remains safe on the unit.  We will continue to monitor the progress towards his goals.

## 2018-02-21 NOTE — [facility] Group Notes (Signed)
LCSW Group Therapy Note 02/21/2018 2:59 PM  Type of Therapy/Topic: Group Therapy: Feelings about Diagnosis  Participation Level: Active   Description of Group:  This group will allow patients to explore their thoughts and feelings about diagnoses they have received. Patients will be guided to explore their level of understanding and acceptance of these diagnoses. Facilitator will encourage patients to process their thoughts and feelings about the reactions of others to their diagnosis and will guide patients in identifying ways to discuss their diagnosis with significant others in their lives. This group will be process-oriented, with patients participating in exploration of their own experiences, giving and receiving support, and processing challenge from other group members.  Therapeutic Goals: 1. Patient will demonstrate understanding of diagnosis as evidenced by identifying two or more symptoms of the disorder 2. Patient will be able to express two feelings regarding the diagnosis 3. Patient will demonstrate their ability to communicate their needs through discussion and/or role play  Summary of Patient Progress:  Steven Lloyd was engaged and participated throughout the group session. Steven Lloyd reports that when he learned he had a mental health diagnosis he was not surprised. Steven Lloyd reports that he has battled depression for a long time and that he wants to learn how he can decrease his symptoms. Steven Lloyd reports that he wants to work on keeping a positive attitude and setting obtainable goals.   Therapeutic Modalities:  Cognitive Behavioral Therapy Brief Therapy Feelings Identification    Chelsi Warr Catalina AntiguaWilliams LCSWA Clinical Social Worker
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
## 2018-02-21 NOTE — Progress Notes (Addendum)
CSW received permission from pt to send [facility], which was added to his ROI.  CSW faxed [phone],[facility] and left voice mail with new pt coordinator there. [phone].  Per Onnie BoerJennifer Clark, this should help us get new pt appt there.   Garner NashGregory Larin Weissberg, MSW, LCSW Clinical Social Worker 02/21/2018 4:08 PM

## 2018-02-21 NOTE — Progress Notes (Signed)
OT Cancellation Note  Patient Details Name: Steven Lloyd MRN: [id number] DOB: 1975-12-03   Cancelled Treatment:    Reason Eval/Treat Not Completed: Fatigue/lethargy limiting ability to participate.  Pt sleeping soundly:  Unable to arouse him. Will check back.    Jaren Vanetten 02/21/2018, 3:49 PM  Marica OtterMaryellen Branon Sabine, OTR/L [phone] 02/21/2018

## 2018-02-22 ENCOUNTER — Inpatient Hospital Stay (HOSPITAL_COMMUNITY)
Admission: AD | Admit: 2018-02-22 | Discharge: 2018-02-22 | Disposition: A | Discharge status: Home / Self Care | Payer: No Typology Code available for payment source | Source: Home / Self Care | Attending: Psychiatry | Admitting: Psychiatry

## 2018-02-22 ENCOUNTER — Inpatient Hospital Stay (HOSPITAL_COMMUNITY): Payer: No Typology Code available for payment source

## 2018-02-22 DIAGNOSIS — R609 Edema, unspecified: Secondary | ICD-10-CM

## 2018-02-22 DIAGNOSIS — M7989 Other specified soft tissue disorders: Secondary | ICD-10-CM

## 2018-02-22 MED ORDER — RIVAROXABAN (XARELTO) VTE STARTER PACK (15 & 20 MG): 15.0000 mg | ORAL_TABLET | Freq: Two times a day (BID) | ORAL | 0 refills | Status: DC

## 2018-02-22 MED ORDER — RIVAROXABAN 15 MG PO TABS
15.0000 mg | ORAL_TABLET | Freq: Two times a day (BID) | ORAL | Status: DC
  Administered 2018-02-23: 15 mg via ORAL
  Filled 2018-02-22 (×5): qty 1

## 2018-02-22 MED ORDER — RIVAROXABAN 15 MG PO TABS: 15.0000 mg | ORAL_TABLET | Freq: Two times a day (BID) | ORAL | Status: DC

## 2018-02-22 MED ORDER — RIVAROXABAN 15 MG PO TABS
15.0000 mg | ORAL_TABLET | Freq: Once | ORAL | Status: AC
  Administered 2018-02-22: 15 mg via ORAL
  Filled 2018-02-22: qty 1

## 2018-02-22 MED ORDER — RIVAROXABAN (XARELTO) EDUCATION KIT FOR DVT/PE PATIENTS
PACK | Freq: Once | Status: AC
  Filled 2018-02-22: qty 1
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
## 2018-02-22 NOTE — Progress Notes (Signed)
Per Dr. Jama Flavorsobos, [facility] out possible [facility] to left lower leg. [facility] [place] [facility]

## 2018-02-22 NOTE — ED Notes (Addendum)
[facility]. Patient is complaining of left lower extremity swelling (ankle) and calf pain. Denies ankle pain.  [facility]. Patient denies any recent injury. Patient is ambulatory. Denies any shortness of breath or chest pain. Spoke with Greta DoomBowie, PA and reports patient would need a further work up than fast track.

## 2018-02-22 NOTE — Progress Notes (Addendum)
Vascular lab progress note: Called at 3:25 pm for patient to be put in room for test and have pants off. Arrived to ED at 3:40 pm. Patient is still in [facility]. Will check back to see if patient is in room.   Farrel DemarkJill Eunice, RDMS, RVT

## 2018-02-22 NOTE — Progress Notes (Signed)
LLE venous duplex prelim: DVT noted in a single left peroneal vein (proximal to mid calf segment). Farrel DemarkJill Eunice, RDMS, RVT Gave results to MD.

## 2018-02-22 NOTE — ED Notes (Signed)
[facility] Expected date:  Expected time:  Means of arrival:  Comments: Pt in room 30 for Vascular

## 2018-02-22 NOTE — Progress Notes (Signed)
Recreation Therapy Notes  Date: 4.3.19 Time: 9:30 a.m. Location: 300 Hall Dayroom   Group Topic: Stress Management   Goal Area(s) Addresses:  Goal 1.1: To reduce stress  -Patient will report feeling a reduction in stress level  -Patient will identify the importance of stress management  -Patient will participate during stress management group treatment     Intervention: Stress Management   Activity: Meditation- Patients were in a peaceful environment with soft lighting enhancing patients mood. Patients listened to a body scan meditation to help decrease tension and stress levels    Education: Stress Management, Discharge Planning.    Education Outcome: Acknowledges edcuation/In group clarification offered/Needs additional education   Clinical Observations/Feedback:: Patient did not attend     Arasely Akkerman, Recreation Therapy Intern   Steven Lloyd 02/22/2018 8:16 AM 

## 2018-02-22 NOTE — Progress Notes (Addendum)
Pharmacy - Xarelto  Assessment: 41 [facility] pain. Venous duplex shows L peroneal DVT. Pharmacy consulted to dose Xarelto for VTE tx.  CBC wnl  CrCl > 50 ml/min  Plan:  Xarelto 15 mg BID AC x 21 days, followed by 20 mg daily AC thereafter  Education provided prior to discharge  Bernadene Personrew Sheriann Newmann, PharmD, BCPS [phone] 02/22/2018, 7:57 PM
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
## 2018-02-22 NOTE — Progress Notes (Signed)
D   Received pt back from ER in stable condition   He did complain that he hadnt had any dinner and was hungry   He also hadnt had pain medications and missed his 1700 medications   He endorses some depression and anxiety  He is cooperative and pleasant on approach  A    Verbal support given   Medications administered and effectiveness monitored    Q 15 min checks  R   Pt is safe at present time

## 2018-02-22 NOTE — ED Notes (Signed)
Bed: [facility] Expected date:  Expected time:  Means of arrival:  Comments: 

## 2018-02-22 NOTE — ED Notes (Signed)
Bed: [facility] Expected date:  Expected time:  Means of arrival:  Comments: No Bed 
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
## 2018-02-22 NOTE — [facility] Group Notes (Signed)
[facility]      02/22/2018 11:15 AM  Type of Therapy: Mental Health Association Presentation  Participation Level: Active  Participation Quality: Attentive  Affect: Appropriate  Cognitive: Oriented  Insight: Developing/Improving  Engagement in Therapy: Engaged  Modes of Intervention: Discussion, Education and Socialization  Summary of Progress/Problems: Mental Health Association (MHA) Speaker came to talk about his personal journey with mental health. The pt processed ways by which to relate to the speaker. MHA speaker provided handouts and educational information pertaining to groups and services offered by [facility]. Pt was engaged in speaker's presentation and was receptive to resources provided.    Alcario DroughtJolan Elisa Sorlie LCSWA Clinical Social Worker
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
## 2018-02-22 NOTE — ED Notes (Signed)
Broke glass on chart to review triage with Zenon MayoJoanna RN when she called with questions.

## 2018-02-23 ENCOUNTER — Emergency Department (HOSPITAL_COMMUNITY)
Admission: EM | Admit: 2018-02-23 | Discharge: 2018-02-23 | Discharge status: Absent without leave | Payer: No Typology Code available for payment source | Attending: Emergency Medicine | Admitting: Emergency Medicine

## 2018-02-23 MED ORDER — TRAZODONE HCL 50 MG PO TABS
50.0000 mg | ORAL_TABLET | Freq: Every evening | ORAL | Status: DC | PRN
  Filled 2018-02-23: qty 7

## 2018-02-23 MED ORDER — METOPROLOL SUCCINATE ER 25 MG PO TB24: 25.0000 mg | ORAL_TABLET | Freq: Every day | ORAL | 0 refills | Status: DC

## 2018-02-23 MED ORDER — PANTOPRAZOLE SODIUM 40 MG PO TBEC: 40.0000 mg | DELAYED_RELEASE_TABLET | Freq: Every day | ORAL | 0 refills | Status: DC

## 2018-02-23 MED ORDER — TAMSULOSIN HCL 0.4 MG PO CAPS: 0.4000 mg | ORAL_CAPSULE | Freq: Every day | ORAL | 0 refills | Status: DC

## 2018-02-23 MED ORDER — HYDROXYZINE HCL 25 MG PO TABS: 25.0000 mg | ORAL_TABLET | Freq: Four times a day (QID) | ORAL | 0 refills | Status: DC | PRN

## 2018-02-23 MED ORDER — BACLOFEN 5 MG PO TABS: 5.0000 mg | ORAL_TABLET | Freq: Three times a day (TID) | ORAL | 0 refills | Status: DC

## 2018-02-23 MED ORDER — TRAZODONE HCL 50 MG PO TABS: 50.0000 mg | ORAL_TABLET | Freq: Every evening | ORAL | 0 refills | Status: DC | PRN

## 2018-02-23 MED ORDER — GABAPENTIN 300 MG PO CAPS: 300.0000 mg | ORAL_CAPSULE | Freq: Three times a day (TID) | ORAL | 0 refills | Status: DC

## 2018-02-23 MED ORDER — FLUOXETINE HCL 20 MG PO CAPS: 20.0000 mg | ORAL_CAPSULE | Freq: Every day | ORAL | 0 refills | Status: DC

## 2018-02-23 MED ORDER — LEVETIRACETAM 500 MG PO TABS: 500.0000 mg | ORAL_TABLET | Freq: Two times a day (BID) | ORAL | 0 refills | Status: DC

## 2018-02-23 MED FILL — XARELTO STARTER PACK: 15 & 20 | 30 days supply | Qty: 51 | Fill #0

## 2018-02-23 NOTE — Progress Notes (Signed)
Pt received both written and verbal discharge instructions. Pt verbalized understanding of discharge instructions. Pt agreed to f/u appt and med regimen. Pt received d/c packet, prescriptions and sample meds. Pt safely discharged to the lobby.
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
## 2018-02-23 NOTE — BHH Group Notes (Signed)
[facility]  Date/Time  02/23/18  1:15pm  Type of Therapy/Topic:  Group Therapy:  Balance in Life  Participation Level:  Moderate, fell asleep during part of group  Description of Group:    This group will address the concept of balance and how it feels and looks when one is unbalanced. Patients will be encouraged to process areas in their lives that are out of balance, and identify reasons for remaining unbalanced. Facilitators will guide patients utilizing problem- solving interventions to address and correct the stressor making their life unbalanced. Understanding and applying boundaries will be explored and addressed for obtaining  and maintaining a balanced life. Patients will be encouraged to explore ways to assertively make their unbalanced needs known to significant others in their lives, using other group members and facilitator for support and feedback.  Therapeutic Goals: 1. Patient will identify two or more emotions or situations they have that consume much of in their lives. 2. Patient will identify signs/triggers that life has become out of balance:  3. Patient will identify two ways to set boundaries in order to achieve balance in their lives:  4. Patient will demonstrate ability to communicate their needs through discussion and/or role plays  Summary of Patient Progress: Patient actively and appropriately participated in the group discussion initially but fell asleep during group. Patient identified mental and physical health as areas of his life that feel most out of balance.  Therapeutic Modalities:   Cognitive Behavioral Therapy Solution-Focused Therapy Assertiveness Training
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
## 2018-02-23 NOTE — Progress Notes (Signed)
  [facility] Discharge Plan :  Will you be returning to the same living situation after discharge:  No. With mother. At discharge, do you have transportation home?: Yes,  mother Do you have the ability to pay for your medications: Yes,  private insurance  Release of information consent forms completed and in the chart;  Patient's signature needed at discharge.  Patient to Follow up at: Follow-up Information    [facility]. Go on 02/27/2018.   Why:  Please attend your follow up appt on Wednesday, 02/27/18, at 8:00am.  Please bring photo ID, social security card, proof of insurance, and proof of household income. Contact information: [address] [place] [address] [phone]        [facility]. Go on 04/25/2018.   Why:  Neuroloigcal evaluation appointment with Dr.Sater at 10:30am on 04/25/18. Check in at 10:00am. Contact information: [address], [place], [place] [address] Phone: [phone]          Next level of care provider has access to [facility] Link:no  Safety Planning and Suicide Prevention discussed: Yes,  with wife  Have you used any form of tobacco in the last 30 days? (Cigarettes, Smokeless Tobacco, Cigars, and/or Pipes): No  Has patient been referred to the Quitline?: N/A patient is not a smoker  Patient has been referred for addiction treatment: Yes  Lorri FrederickWierda, Cris Gibby Jon, LCSW 02/23/2018, 9:20 AM

## 2018-02-28 ENCOUNTER — Encounter: Payer: Self-pay | Admitting: Family Medicine

## 2018-03-08 NOTE — ED Provider Notes (Signed)
I was not directly involved in patient care on 02/22/2018   Fayrene Helperran, Shenita Trego, PA-C 03/08/18 [id number]    Shaune PollackIsaacs, Cameron, MD 03/09/18 [phone]

## 2018-03-15 ENCOUNTER — Ambulatory Visit: Payer: Self-pay | Admitting: Urology

## 2018-03-24 ENCOUNTER — Other Ambulatory Visit: Payer: Self-pay | Admitting: Family Medicine

## 2018-03-24 MED ORDER — RIVAROXABAN 20 MG PO TABS: 20.0000 mg | ORAL_TABLET | Freq: Every day | ORAL | 0 refills | Status: DC

## 2018-03-24 NOTE — Telephone Encounter (Signed)
Patient was seen in hospital [place], he was given blood thinners for a blood clot, he needs a refill. Pharmacy: [facility] on [address]  Cb#: [phone]

## 2018-03-24 NOTE — Telephone Encounter (Signed)
Please call patient and find out what is going on.  I have read that he has a DVT.  We will need to find out where he is getting this medication, how long he has been on it, and how he is doing at this time.  Has he missed any doses?

## 2018-03-24 NOTE — Telephone Encounter (Signed)
Per Dr. Tracie Harrier, I am sending in Xarelto 20 mg #30, no refills. He should keep his scheduled followup.

## 2018-03-24 NOTE — Telephone Encounter (Signed)
Called patient to inform, number in chart does not work, called number in message, left message to return call.

## 2018-03-28 ENCOUNTER — Encounter: Payer: Self-pay | Admitting: Family Medicine

## 2018-03-28 ENCOUNTER — Ambulatory Visit (INDEPENDENT_AMBULATORY_CARE_PROVIDER_SITE_OTHER): Payer: PRIVATE HEALTH INSURANCE | Admitting: Family Medicine

## 2018-03-28 ENCOUNTER — Telehealth: Payer: Self-pay | Admitting: Family Medicine

## 2018-03-28 ENCOUNTER — Other Ambulatory Visit: Payer: Self-pay

## 2018-03-28 VITALS — BP 106/60 | HR 72 | Temp 98.7°F | Resp 16 | Ht 68.0 in | Wt 164.0 lb

## 2018-03-28 DIAGNOSIS — Z915 Personal history of self-harm: Secondary | ICD-10-CM

## 2018-03-28 DIAGNOSIS — F39 Unspecified mood [affective] disorder: Secondary | ICD-10-CM

## 2018-03-28 DIAGNOSIS — I82492 Acute embolism and thrombosis of other specified deep vein of left lower extremity: Secondary | ICD-10-CM | POA: Diagnosis not present

## 2018-03-28 DIAGNOSIS — I82452 Acute embolism and thrombosis of left peroneal vein: Secondary | ICD-10-CM

## 2018-03-28 DIAGNOSIS — Z9151 Personal history of suicidal behavior: Secondary | ICD-10-CM

## 2018-03-28 MED ORDER — WARFARIN SODIUM 5 MG PO TABS: 5.0000 mg | ORAL_TABLET | Freq: Every day | ORAL | 3 refills | Status: DC

## 2018-03-28 MED ORDER — ENOXAPARIN SODIUM 80 MG/0.8ML ~~LOC~~ SOLN: 1.0000 mg/kg | Freq: Two times a day (BID) | SUBCUTANEOUS | 0 refills | Status: DC

## 2018-03-28 NOTE — Telephone Encounter (Signed)
Called pharmacy. Lovenox is $211.60. Coumadin is $20.74  Spoke with patient, he is unable to afford the Lovenox at this time. He has a dose of Xarelto for today and tomorrow. I let him know I would let Dr.Hagler know and we would get back to him.

## 2018-03-28 NOTE — Telephone Encounter (Signed)
I have put in the order for the Lovenox and also the Coumadin.  Please check with pharmacy in approximately 30 minutes.  I spoke with the pharmacist and he said they would run it through and see if his insurance was going to cover it.  Find out the cost and talk with patient.  See if he is going to be able to afford it.  If not we will have to come up with an alternate plan.  Please call and let me know once you have gotten the price and discussed with the patient if he is going to be able to afford the medication.  In addition, he is going to need to have his INR checked and we will give him instructions on this after we get the plan.
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
## 2018-03-28 NOTE — Telephone Encounter (Signed)
Patient left voicemail that his medication prescribed today during his office visit was $485 and he was not sure what to do. Please advise.  He left second  Message with his phone #: [phone]

## 2018-03-28 NOTE — Telephone Encounter (Signed)
I called the pharmacy and they stated they would need Lovenox script before they could give price because they would have to run it. I have an application printed for Xarelto assistance but I am unsure how long it takes to process it.

## 2018-03-28 NOTE — Telephone Encounter (Signed)
Please page the inpatient team/hospitalist so that I can speak with them regarding this patient. Just get me out of a room when you get them on the phone. Steven Lloyd. Tracie Harrier, MD

## 2018-03-29 NOTE — Care Management (Addendum)
Discussed patient with Hospitalist who was notified by PCP that patient is having trouble affording Xarelto, may need to switch medication. Dr. Tracie Harrier is patient's PCP.  Discussed case with Dr Tracie Harrier via phone.  Patient has been taking Xarelto for DVT.    Patient does not have insurance with prescription coverage but does have a discount card. Discussed with [facility] via phone.  Discussed with patient over the phone, [facility] supplied patient with Xarelto packet of pills for 30 days. Patient has been to [facility] today for help with cost of Xarelto.   CM called [facility] and spoke with Marylu Lund who is helping patient. She has contacted Xarelto assistance program and is working to help patient with cost.   Since patient has been using [employer], CM went to [employer] and supplied a 30 day free coupon for patient.  [facility] pharmacy tech Fleet Contras) reports they already have a prescription on file for patient.   Per Fleet Contras, patient can pick up prescription today. Patient notified and will pick up today.   CM called Marylu Lund of [facility] department to make aware of plan. She will continue to work on future arrangements for patient. Patient is also aware to continue to follow up with [facility] for assistance with medication affordability.   Notified Dr. Malena Peer office of plan.

## 2018-03-29 NOTE — Telephone Encounter (Signed)
Patient advised with verbal understanding  

## 2018-03-29 NOTE — Telephone Encounter (Signed)
Spoke with Patient regarding concerns expressed to his Doctor about services at [facility].  Patient reports that he has not developed a good rapport with his therapist and would like to try someone else.  Patient had more positive experiences working with therapist at [facility] during inpatient stay and would like to try [facility] for his ongoing counseling. Patient reports no signs of crisis or need for immediate assistance but states he will call the [facility] crisis line if that changes.

## 2018-03-29 NOTE — Telephone Encounter (Signed)
Advise that we will discuss at follow up.

## 2018-03-29 NOTE — Telephone Encounter (Signed)
Received a message that patient was able to get a 30 day supply of Xarelto for free and the Health Dept was working on helping patient with his medications. I called patient and made sure he knew that the medication was waiting at the pharmacy for him. He verbalized understanding. He also asked if numbness in his arm was a sign of anything. He said it was in his left arm. He said it was there before anything happened. I said I was unsure, but I would send Dr.Hagler a message.

## 2018-03-29 NOTE — Telephone Encounter (Signed)
Patient reports that he is currently doing well but does not get a lot out of the services he is receiving at [facility].  Patient would like to try a different therapist to see if it is a better fit.  Patient is willing to go to [facility] in [place], [place]

## 2018-04-25 ENCOUNTER — Encounter: Payer: Self-pay | Admitting: Neurology

## 2018-04-25 ENCOUNTER — Other Ambulatory Visit: Payer: Self-pay

## 2018-04-25 ENCOUNTER — Ambulatory Visit (INDEPENDENT_AMBULATORY_CARE_PROVIDER_SITE_OTHER): Payer: No Typology Code available for payment source | Admitting: Neurology

## 2018-04-25 VITALS — BP 112/71 | HR 69 | Resp 16 | Ht 68.0 in | Wt 170.0 lb

## 2018-04-25 DIAGNOSIS — F332 Major depressive disorder, recurrent severe without psychotic features: Secondary | ICD-10-CM

## 2018-04-25 DIAGNOSIS — W3400XA Accidental discharge from unspecified firearms or gun, initial encounter: Secondary | ICD-10-CM | POA: Diagnosis not present

## 2018-04-25 DIAGNOSIS — S069X9S Unspecified intracranial injury with loss of consciousness of unspecified duration, sequela: Secondary | ICD-10-CM

## 2018-04-25 DIAGNOSIS — S020XXS Fracture of vault of skull, sequela: Secondary | ICD-10-CM | POA: Diagnosis not present

## 2018-04-25 DIAGNOSIS — S069XAS Unspecified intracranial injury with loss of consciousness status unknown, sequela: Secondary | ICD-10-CM
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
## 2018-04-26 ENCOUNTER — Other Ambulatory Visit: Payer: Self-pay

## 2018-04-26 ENCOUNTER — Other Ambulatory Visit: Payer: Self-pay | Admitting: Family Medicine

## 2018-04-26 MED ORDER — XARELTO 20 MG PO TABS: 20.0000 mg | ORAL_TABLET | Freq: Every day | ORAL | 0 refills | Status: DC

## 2018-04-28 ENCOUNTER — Ambulatory Visit: Payer: Self-pay | Admitting: Family Medicine

## 2018-04-28 ENCOUNTER — Encounter: Payer: Self-pay | Admitting: Family Medicine

## 2018-05-02 ENCOUNTER — Encounter (INDEPENDENT_AMBULATORY_CARE_PROVIDER_SITE_OTHER): Payer: Self-pay

## 2018-05-02 ENCOUNTER — Encounter: Payer: Self-pay | Admitting: Family Medicine

## 2018-05-02 ENCOUNTER — Ambulatory Visit (INDEPENDENT_AMBULATORY_CARE_PROVIDER_SITE_OTHER): Payer: PRIVATE HEALTH INSURANCE | Admitting: Family Medicine

## 2018-05-02 ENCOUNTER — Telehealth: Payer: Self-pay | Admitting: Family Medicine

## 2018-05-02 ENCOUNTER — Other Ambulatory Visit: Payer: Self-pay

## 2018-05-02 VITALS — BP 110/56 | HR 62 | Temp 98.9°F | Resp 14 | Ht 68.0 in | Wt 167.1 lb

## 2018-05-02 DIAGNOSIS — I82452 Acute embolism and thrombosis of left peroneal vein: Secondary | ICD-10-CM

## 2018-05-02 DIAGNOSIS — R202 Paresthesia of skin: Secondary | ICD-10-CM

## 2018-05-02 DIAGNOSIS — Z79899 Other long term (current) drug therapy: Secondary | ICD-10-CM | POA: Diagnosis not present

## 2018-05-02 DIAGNOSIS — I82492 Acute embolism and thrombosis of other specified deep vein of left lower extremity: Secondary | ICD-10-CM | POA: Diagnosis not present

## 2018-05-02 DIAGNOSIS — F332 Major depressive disorder, recurrent severe without psychotic features: Secondary | ICD-10-CM

## 2018-05-02 NOTE — Telephone Encounter (Signed)
Patient was not able to have labs done today for financial reasons, he will be contacting his insurance company for further information. He stopped by after appt to let you know
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
## 2018-05-19 ENCOUNTER — Ambulatory Visit: Payer: PRIVATE HEALTH INSURANCE | Admitting: Family Medicine

## 2018-05-24 ENCOUNTER — Other Ambulatory Visit: Payer: Self-pay | Admitting: Family Medicine

## 2018-05-29 NOTE — Telephone Encounter (Signed)
[facility] - Left Msg 

## 2018-06-01 ENCOUNTER — Telehealth: Payer: Self-pay

## 2018-06-01 NOTE — Telephone Encounter (Signed)
[facility] - Left Msg 

## 2018-06-20 ENCOUNTER — Ambulatory Visit (HOSPITAL_COMMUNITY): Payer: Self-pay | Admitting: Psychiatry

## 2018-06-21 NOTE — Telephone Encounter (Signed)
[facility] - Left Msg 

## 2018-06-29 ENCOUNTER — Other Ambulatory Visit: Payer: Self-pay

## 2018-06-29 ENCOUNTER — Telehealth: Payer: Self-pay

## 2018-06-29 ENCOUNTER — Ambulatory Visit (INDEPENDENT_AMBULATORY_CARE_PROVIDER_SITE_OTHER): Payer: No Typology Code available for payment source | Admitting: Neurology

## 2018-06-29 ENCOUNTER — Encounter: Payer: Self-pay | Admitting: Neurology

## 2018-06-29 VITALS — BP 127/82 | HR 70 | Resp 14 | Ht 68.0 in | Wt 169.0 lb

## 2018-06-29 DIAGNOSIS — R200 Anesthesia of skin: Secondary | ICD-10-CM | POA: Diagnosis not present

## 2018-06-29 DIAGNOSIS — S5402XA Injury of ulnar nerve at forearm level, left arm, initial encounter: Secondary | ICD-10-CM | POA: Insufficient documentation

## 2018-06-29 DIAGNOSIS — F332 Major depressive disorder, recurrent severe without psychotic features: Secondary | ICD-10-CM

## 2018-06-29 DIAGNOSIS — W3400XA Accidental discharge from unspecified firearms or gun, initial encounter: Secondary | ICD-10-CM

## 2018-06-29 DIAGNOSIS — S020XXS Fracture of vault of skull, sequela: Secondary | ICD-10-CM

## 2018-06-29 DIAGNOSIS — S069X9S Unspecified intracranial injury with loss of consciousness of unspecified duration, sequela: Secondary | ICD-10-CM

## 2018-06-29 DIAGNOSIS — G5622 Lesion of ulnar nerve, left upper limb: Secondary | ICD-10-CM | POA: Insufficient documentation
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
## 2018-06-29 NOTE — Telephone Encounter (Signed)
Several attempts have been made to contact patient without success. Patient will be placed on the inactive list.  If  You have any questions or if services are needed again,   please contact [facility] at [phone] or place another consult request in the [facility] pool   Information will be routed to the PCP and Dr. Vanetta ShawlHisada    Per chart review, patient had an appt with Dr. Lolly MustacheArfeen on 06-20-2018

## 2018-07-04 ENCOUNTER — Ambulatory Visit: Payer: Self-pay | Admitting: Family Medicine

## 2018-07-05 ENCOUNTER — Ambulatory Visit (INDEPENDENT_AMBULATORY_CARE_PROVIDER_SITE_OTHER): Payer: PRIVATE HEALTH INSURANCE | Admitting: Family Medicine

## 2018-07-05 ENCOUNTER — Other Ambulatory Visit: Payer: Self-pay

## 2018-07-05 ENCOUNTER — Encounter: Payer: Self-pay | Admitting: Family Medicine

## 2018-07-05 ENCOUNTER — Other Ambulatory Visit (HOSPITAL_COMMUNITY)
Admission: RE | Admit: 2018-07-05 | Discharge: 2018-07-05 | Disposition: A | Discharge status: Home / Self Care | Payer: PRIVATE HEALTH INSURANCE | Source: Ambulatory Visit | Attending: Family Medicine | Admitting: Family Medicine

## 2018-07-05 VITALS — BP 108/80 | HR 90 | Resp 16 | Ht 68.0 in | Wt 169.0 lb

## 2018-07-05 DIAGNOSIS — R002 Palpitations: Secondary | ICD-10-CM | POA: Insufficient documentation

## 2018-07-05 DIAGNOSIS — F1490 Cocaine use, unspecified, uncomplicated: Secondary | ICD-10-CM | POA: Diagnosis not present

## 2018-07-05 DIAGNOSIS — I82492 Acute embolism and thrombosis of other specified deep vein of left lower extremity: Secondary | ICD-10-CM | POA: Diagnosis not present

## 2018-07-05 DIAGNOSIS — I82452 Acute embolism and thrombosis of left peroneal vein: Secondary | ICD-10-CM

## 2018-07-05 LAB — BASIC METABOLIC PANEL
Anion gap: 7 (ref 5–15)
BUN: 11 mg/dL (ref 6–20)
CALCIUM: 9.2 mg/dL (ref 8.9–10.3)
CHLORIDE: 106 mmol/L (ref 98–111)
CO2: 26 mmol/L (ref 22–32)
Creatinine, Ser: 1.28 mg/dL — ABNORMAL HIGH (ref 0.61–1.24)
GFR calc non Af Amer: >60 mL/min (ref >60)
Glucose, Bld: 65 mg/dL — ABNORMAL LOW (ref 70–99)
Potassium: 4.2 mmol/L (ref 3.5–5.1)
SODIUM: 139 mmol/L (ref 135–145)

## 2018-07-05 LAB — TSH: TSH: 0.958 u[IU]/mL (ref 0.350–4.500)

## 2018-07-05 LAB — MAGNESIUM: Magnesium: 2.5 mg/dL — ABNORMAL HIGH (ref 1.7–2.4)
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
## 2018-07-06 ENCOUNTER — Encounter: Payer: Self-pay | Admitting: Family Medicine

## 2018-08-30 ENCOUNTER — Ambulatory Visit: Payer: Self-pay | Admitting: Cardiology

## 2018-10-02 ENCOUNTER — Encounter: Payer: Self-pay | Admitting: Cardiology

## 2018-10-02 ENCOUNTER — Ambulatory Visit (INDEPENDENT_AMBULATORY_CARE_PROVIDER_SITE_OTHER): Payer: PRIVATE HEALTH INSURANCE | Admitting: Cardiology

## 2018-10-02 VITALS — BP 124/78 | HR 90 | Ht 68.0 in | Wt 168.4 lb

## 2018-10-02 DIAGNOSIS — R0789 Other chest pain: Secondary | ICD-10-CM | POA: Diagnosis not present

## 2018-10-02 NOTE — Patient Instructions (Signed)
Medication Instructions:  Your physician recommends that you continue on your current medications as directed. Please refer to the Current Medication list given to you today.  If you need a refill on your cardiac medications before your next appointment, please call your pharmacy.   Lab work: None    Follow-Up: With Dr.Branch as needed
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
## 2018-10-08 ENCOUNTER — Other Ambulatory Visit: Payer: Self-pay | Admitting: Family Medicine

## 2019-06-06 ENCOUNTER — Encounter (HOSPITAL_COMMUNITY): Payer: Self-pay | Admitting: Emergency Medicine

## 2019-06-06 ENCOUNTER — Emergency Department (HOSPITAL_COMMUNITY)
Admission: EM | Admit: 2019-06-06 | Discharge: 2019-06-06 | Disposition: A | Discharge status: Home / Self Care | Payer: No Typology Code available for payment source | Attending: Emergency Medicine | Admitting: Emergency Medicine

## 2019-06-06 ENCOUNTER — Other Ambulatory Visit: Payer: Self-pay

## 2019-06-06 DIAGNOSIS — J029 Acute pharyngitis, unspecified: Secondary | ICD-10-CM

## 2019-06-06 LAB — GROUP A STREP BY PCR: Group A Strep by PCR: NOT DETECTED

## 2019-06-06 MED ORDER — CETIRIZINE HCL 10 MG PO TABS: 10.0000 mg | ORAL_TABLET | Freq: Every day | ORAL | 0 refills | Status: DC

## 2019-06-06 MED ORDER — DEXAMETHASONE SODIUM PHOSPHATE 10 MG/ML IJ SOLN
10.0000 mg | Freq: Once | INTRAMUSCULAR | Status: AC
  Administered 2019-06-06: 10 mg via INTRAMUSCULAR
  Filled 2019-06-06: qty 1
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
## 2019-06-06 NOTE — ED Triage Notes (Signed)
Pt c/o sore throat x 3 days. States when he mowed outside yesterday, it became worse

## 2019-06-09 ENCOUNTER — Encounter (HOSPITAL_COMMUNITY): Payer: Self-pay | Admitting: Emergency Medicine

## 2019-06-09 ENCOUNTER — Other Ambulatory Visit: Payer: Self-pay

## 2019-06-09 DIAGNOSIS — J029 Acute pharyngitis, unspecified: Secondary | ICD-10-CM | POA: Insufficient documentation

## 2019-06-09 DIAGNOSIS — Z79899 Other long term (current) drug therapy: Secondary | ICD-10-CM | POA: Insufficient documentation

## 2019-06-09 NOTE — ED Triage Notes (Signed)
Pt also c/o left ear pain
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
## 2019-06-10 ENCOUNTER — Emergency Department (HOSPITAL_COMMUNITY)
Admission: EM | Admit: 2019-06-10 | Discharge: 2019-06-10 | Disposition: A | Discharge status: Home / Self Care | Payer: Self-pay | Attending: Emergency Medicine | Admitting: Emergency Medicine

## 2019-06-10 ENCOUNTER — Emergency Department (HOSPITAL_COMMUNITY): Payer: Self-pay

## 2019-06-10 DIAGNOSIS — J029 Acute pharyngitis, unspecified: Secondary | ICD-10-CM

## 2019-06-10 MED ORDER — AMOXICILLIN 250 MG PO CAPS
500.0000 mg | ORAL_CAPSULE | Freq: Once | ORAL | Status: AC
  Administered 2019-06-10: 500 mg via ORAL
  Filled 2019-06-10: qty 2

## 2019-06-10 MED ORDER — PREDNISONE 50 MG PO TABS
60.0000 mg | ORAL_TABLET | Freq: Once | ORAL | Status: AC
  Administered 2019-06-10: 60 mg via ORAL
  Filled 2019-06-10 (×2): qty 1

## 2019-06-10 MED ORDER — ALUM & MAG HYDROXIDE-SIMETH 200-200-20 MG/5ML PO SUSP
30.0000 mL | Freq: Once | ORAL | Status: AC
  Administered 2019-06-10: 30 mL via ORAL
  Filled 2019-06-10 (×2): qty 30

## 2019-06-10 MED ORDER — AMOXICILLIN 500 MG PO CAPS: 500.0000 mg | ORAL_CAPSULE | Freq: Three times a day (TID) | ORAL | 0 refills | Status: DC

## 2019-06-10 MED ORDER — DIPHENHYDRAMINE HCL 25 MG PO CAPS
25.0000 mg | ORAL_CAPSULE | Freq: Once | ORAL | Status: AC
  Administered 2019-06-10: 25 mg via ORAL
  Filled 2019-06-10 (×2): qty 1
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
## 2019-06-10 NOTE — Discharge Instructions (Signed)
Return if any problems.

## 2019-06-23 ENCOUNTER — Emergency Department (HOSPITAL_COMMUNITY): Payer: HRSA Program

## 2019-06-23 ENCOUNTER — Encounter (HOSPITAL_COMMUNITY): Payer: Self-pay | Admitting: Emergency Medicine

## 2019-06-23 ENCOUNTER — Other Ambulatory Visit: Payer: Self-pay

## 2019-06-23 ENCOUNTER — Emergency Department (HOSPITAL_COMMUNITY)
Admission: EM | Admit: 2019-06-23 | Discharge: 2019-06-23 | Disposition: A | Discharge status: Home / Self Care | Payer: HRSA Program | Attending: Emergency Medicine | Admitting: Emergency Medicine

## 2019-06-23 DIAGNOSIS — F1210 Cannabis abuse, uncomplicated: Secondary | ICD-10-CM | POA: Insufficient documentation

## 2019-06-23 DIAGNOSIS — U071 COVID-19: Secondary | ICD-10-CM | POA: Diagnosis not present

## 2019-06-23 DIAGNOSIS — R059 Cough, unspecified: Secondary | ICD-10-CM

## 2019-06-23 DIAGNOSIS — R6883 Chills (without fever): Secondary | ICD-10-CM | POA: Diagnosis not present

## 2019-06-23 DIAGNOSIS — R0981 Nasal congestion: Secondary | ICD-10-CM | POA: Insufficient documentation

## 2019-06-23 DIAGNOSIS — Z79899 Other long term (current) drug therapy: Secondary | ICD-10-CM | POA: Insufficient documentation

## 2019-06-23 DIAGNOSIS — R05 Cough: Secondary | ICD-10-CM | POA: Diagnosis present

## 2019-06-23 NOTE — ED Triage Notes (Signed)
Pt reports being seen twice in past few weeks for sore throat. Has developed slight nonproductive cough and had some chills last night. States he is paranoid he may have Covid.
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
## 2019-06-23 NOTE — Discharge Instructions (Addendum)
Your chest x-ray is clear today with no sign of lung infection.  I encourage you to continue using your Claritin, you may also use Tylenol or ibuprofen for body aches and fever reduction.  Your COVID test should result within the next 1 to 2 days and you will be notified if this test is positive.  You may also call here for the test results on Monday if you have not heard from [place].  In the interim you should maintain home quarantine as we discussed.     Person Under Monitoring Name: Steven Lloyd  Location: [address] [place] [address]   Infection Prevention Recommendations for Individuals Confirmed to have, or Being Evaluated for, 2019 Novel Coronavirus (COVID-19) Infection Who Receive Care at Home  Individuals who are confirmed to have, or are being evaluated for, COVID-19 should follow the prevention steps below until a healthcare provider or local or state health department says they can return to normal activities.  Stay home except to get medical care You should restrict activities outside your home, except for getting medical care. Do not go to work, school, or public areas, and do not use public transportation or taxis.  Call ahead before visiting your doctor Before your medical appointment, call the healthcare provider and tell them that you have, or are being evaluated for, COVID-19 infection. This will help the healthcare providers office take steps to keep other people from getting infected. Ask your healthcare provider to call the local or state health department.  Monitor your symptoms Seek prompt medical attention if your illness is worsening (e.g., difficulty breathing). Before going to your medical appointment, call the healthcare provider and tell them that you have, or are being evaluated for, COVID-19 infection. Ask your healthcare provider to call the local or state health department.  Wear a facemask You should wear a facemask that covers your  nose and mouth when you are in the same room with other people and when you visit a healthcare provider. People who live with or visit you should also wear a facemask while they are in the same room with you.  Separate yourself from other people in your home As much as possible, you should stay in a different room from other people in your home. Also, you should use a separate bathroom, if available.  Avoid sharing household items You should not share dishes, drinking glasses, cups, eating utensils, towels, bedding, or other items with other people in your home. After using these items, you should wash them thoroughly with soap and water.  Cover your coughs and sneezes Cover your mouth and nose with a tissue when you cough or sneeze, or you can cough or sneeze into your sleeve. Throw used tissues in a lined trash can, and immediately wash your hands with soap and water for at least 20 seconds or use an alcohol-based hand rub.  Wash your [facility] your hands often and thoroughly with soap and water for at least 20 seconds. You can use an alcohol-based hand sanitizer if soap and water are not available and if your hands are not visibly dirty. Avoid touching your eyes, nose, and mouth with unwashed hands.   Prevention Steps for Caregivers and Household Members of Individuals Confirmed to have, or Being Evaluated for, COVID-19 Infection Being Cared for in the Home  If you live with, or provide care at home for, a person confirmed to have, or being evaluated for, COVID-19 infection please follow these guidelines to  prevent infection:  Follow healthcare providers instructions Make sure that you understand and can help the patient follow any healthcare provider instructions for all care.  Provide for the patients basic needs You should help the patient with basic needs in the home and provide support for getting groceries, prescriptions, and other personal needs.  Monitor the  patients symptoms If they are getting sicker, call his or her medical provider and tell them that the patient has, or is being evaluated for, COVID-19 infection. This will help the healthcare providers office take steps to keep other people from getting infected. Ask the healthcare provider to call the local or state health department.  Limit the number of people who have contact with the patient If possible, have only one caregiver for the patient. Other household members should stay in another home or place of residence. If this is not possible, they should stay in another room, or be separated from the patient as much as possible. Use a separate bathroom, if available. Restrict visitors who do not have an essential need to be in the home.  Keep older adults, very young children, and other sick people away from the patient Keep older adults, very young children, and those who have compromised immune systems or chronic health conditions away from the patient. This includes people with chronic heart, lung, or kidney conditions, diabetes, and cancer.  Ensure good ventilation Make sure that shared spaces in the home have good air flow, such as from an air conditioner or an opened window, weather permitting.  Wash your hands often Wash your hands often and thoroughly with soap and water for at least 20 seconds. You can use an alcohol based hand sanitizer if soap and water are not available and if your hands are not visibly dirty. Avoid touching your eyes, nose, and mouth with unwashed hands. Use disposable paper towels to dry your hands. If not available, use dedicated cloth towels and replace them when they become wet.  Wear a facemask and gloves Wear a disposable facemask at all times in the room and gloves when you touch or have contact with the patients blood, body fluids, and/or secretions or excretions, such as sweat, saliva, sputum, nasal mucus, vomit, urine, or feces.  Ensure the mask  fits over your nose and mouth tightly, and do not touch it during use. Throw out disposable facemasks and gloves after using them. Do not reuse. Wash your hands immediately after removing your facemask and gloves. If your personal clothing becomes contaminated, carefully remove clothing and launder. Wash your hands after handling contaminated clothing. Place all used disposable facemasks, gloves, and other waste in a lined container before disposing them with other household waste. Remove gloves and wash your hands immediately after handling these items.  Do not share dishes, glasses, or other household items with the patient Avoid sharing household items. You should not share dishes, drinking glasses, cups, eating utensils, towels, bedding, or other items with a patient who is confirmed to have, or being evaluated for, COVID-19 infection. After the person uses these items, you should wash them thoroughly with soap and water.  Wash laundry thoroughly Immediately remove and wash clothes or bedding that have blood, body fluids, and/or secretions or excretions, such as sweat, saliva, sputum, nasal mucus, vomit, urine, or feces, on them. Wear gloves when handling laundry from the patient. Read and follow directions on labels of laundry or clothing items and detergent. In general, wash and dry with the warmest temperatures recommended on  the label.  Clean all areas the individual has used often Clean all touchable surfaces, such as counters, tabletops, doorknobs, bathroom fixtures, toilets, phones, keyboards, tablets, and bedside tables, every day. Also, clean any surfaces that may have blood, body fluids, and/or secretions or excretions on them. Wear gloves when cleaning surfaces the patient has come in contact with. Use a diluted bleach solution (e.g., dilute bleach with 1 part bleach and 10 parts water) or a household disinfectant with a label that says EPA-registered for coronaviruses. To make a  bleach solution at home, add 1 tablespoon of bleach to 1 quart (4 cups) of water. For a larger supply, add  cup of bleach to 1 gallon (16 cups) of water. Read labels of cleaning products and follow recommendations provided on product labels. Labels contain instructions for safe and effective use of the cleaning product including precautions you should take when applying the product, such as wearing gloves or eye protection and making sure you have good ventilation during use of the product. Remove gloves and wash hands immediately after cleaning.  Monitor yourself for signs and symptoms of illness Caregivers and household members are considered close contacts, should monitor their health, and will be asked to limit movement outside of the home to the extent possible. Follow the monitoring steps for close contacts listed on the symptom monitoring form.   ? If you have additional questions, contact your local health department or call the epidemiologist on call at [phone] (available 24/7). ? This guidance is subject to change. For the most up-to-date guidance from [facility], please refer to their website: TripMetro.huhttps://www.cdc.gov/coronavirus/2019-ncov/hcp/guidance-prevent-spread.html

## 2019-06-25 LAB — NOVEL CORONAVIRUS, NAA (HOSP ORDER, SEND-OUT TO REF LAB; TAT 18-24 HRS): SARS-CoV-2, NAA: DETECTED — AB

## 2019-07-23 ENCOUNTER — Other Ambulatory Visit: Payer: Self-pay

## 2019-07-23 DIAGNOSIS — Z20822 Contact with and (suspected) exposure to covid-19: Secondary | ICD-10-CM

## 2019-07-25 ENCOUNTER — Telehealth: Payer: Self-pay

## 2019-07-25 LAB — NOVEL CORONAVIRUS, NAA: SARS-CoV-2, NAA: NOT DETECTED

## 2019-07-25 NOTE — Telephone Encounter (Signed)
Patient called in and informed of negative test results.  

## 2020-02-20 ENCOUNTER — Emergency Department (HOSPITAL_COMMUNITY)
Admission: EM | Admit: 2020-02-20 | Discharge: 2020-02-20 | Disposition: A | Discharge status: Home / Self Care | Payer: Self-pay | Attending: Emergency Medicine | Admitting: Emergency Medicine

## 2020-02-20 ENCOUNTER — Encounter (HOSPITAL_COMMUNITY): Payer: Self-pay | Admitting: *Deleted

## 2020-02-20 ENCOUNTER — Emergency Department (HOSPITAL_COMMUNITY): Payer: Self-pay

## 2020-02-20 ENCOUNTER — Other Ambulatory Visit: Payer: Self-pay

## 2020-02-20 DIAGNOSIS — W540XXA Bitten by dog, initial encounter: Secondary | ICD-10-CM | POA: Insufficient documentation

## 2020-02-20 DIAGNOSIS — Z23 Encounter for immunization: Secondary | ICD-10-CM | POA: Insufficient documentation

## 2020-02-20 DIAGNOSIS — S61451A Open bite of right hand, initial encounter: Secondary | ICD-10-CM | POA: Insufficient documentation

## 2020-02-20 DIAGNOSIS — Y929 Unspecified place or not applicable: Secondary | ICD-10-CM | POA: Insufficient documentation

## 2020-02-20 DIAGNOSIS — Y999 Unspecified external cause status: Secondary | ICD-10-CM | POA: Insufficient documentation

## 2020-02-20 DIAGNOSIS — Y9389 Activity, other specified: Secondary | ICD-10-CM | POA: Insufficient documentation

## 2020-02-20 MED ORDER — TETANUS-DIPHTH-ACELL PERTUSSIS 5-2.5-18.5 LF-MCG/0.5 IM SUSP
0.5000 mL | Freq: Once | INTRAMUSCULAR | Status: AC
  Administered 2020-02-20: 0.5 mL via INTRAMUSCULAR
  Filled 2020-02-20: qty 0.5

## 2020-02-20 MED ORDER — AMOXICILLIN-POT CLAVULANATE 875-125 MG PO TABS: 1.0000 | ORAL_TABLET | Freq: Two times a day (BID) | ORAL | 0 refills | Status: AC

## 2020-02-20 MED ORDER — RABIES IMMUNE GLOBULIN 150 UNIT/ML IM INJ
20.0000 [IU]/kg | INJECTION | Freq: Once | INTRAMUSCULAR | Status: DC
  Filled 2020-02-20: qty 10

## 2020-02-20 MED ORDER — AMOXICILLIN-POT CLAVULANATE 875-125 MG PO TABS
1.0000 | ORAL_TABLET | Freq: Once | ORAL | Status: AC
  Administered 2020-02-20: 1 via ORAL
  Filled 2020-02-20: qty 1

## 2020-02-20 MED ORDER — RABIES VACCINE, PCEC IM SUSR: 1.0000 mL | Freq: Once | INTRAMUSCULAR | Status: DC

## 2020-02-20 MED ORDER — BACITRACIN ZINC 500 UNIT/GM EX OINT
TOPICAL_OINTMENT | Freq: Once | CUTANEOUS | Status: DC
  Filled 2020-02-20: qty 0.9

## 2020-02-20 MED ORDER — RABIES IMMUNE GLOBULIN 150 UNIT/ML IM INJ: 20.0000 [IU]/kg | INJECTION | Freq: Once | INTRAMUSCULAR | Status: DC

## 2020-02-20 NOTE — ED Notes (Signed)
[facility] [employer] called and is sending office to ED to take a report.

## 2020-02-20 NOTE — ED Notes (Signed)
[name] Steven Lloyd came to take report for animal bite, patient left before giving report, having hand dressed or getting discharge papers.

## 2020-02-20 NOTE — Discharge Instructions (Addendum)
You have been diagnosed today with dog bite of the right hand.  At this time there does not appear to be the presence of an emergent medical condition, however there is always the potential for conditions to change. Please read and follow the below instructions.  Please return to the Emergency Department immediately for any new or worsening symptoms. Please be sure to follow up with your Primary Care Provider within one week regarding your visit today; please call their office to schedule an appointment even if you are feeling better for a follow-up visit. Both dogs will need to be closely monitored by animal control.  Please follow all animal control recommendations regarding the animals.  If they exhibit any signs of rabies then you will need to return to the ER immediately for rabies shots.  Be sure to follow-up with your animals events as well to ensure adequate vaccinations. Please take the antibiotic Augmentin as prescribed to avoid infection of your right hand, please take until complete.  Get help right away if: You have a red streak going away from your wound. You have any of these coming from your wound: Non-clear fluid. More blood. Pus or a bad smell. You have trouble moving your injured area. You lose feeling (have numbness) or feel tingling anywhere on your body. You have any new/concerning or worsening of symptoms  Please read the additional information packets attached to your discharge summary.  Do not take your medicine if  develop an itchy rash, swelling in your mouth or lips, or difficulty breathing; call 911 and seek immediate emergency medical attention if this occurs.  Note: Portions of this text may have been transcribed using voice recognition software. Every effort was made to ensure accuracy; however, inadvertent computerized transcription errors may still be present.

## 2020-02-20 NOTE — ED Triage Notes (Signed)
Pt states he was breaking up a dog fight with his and his brother's dog and his dog bit him on the right hand; pt has 3 lacerations to right hand, minimal bleeding noted; pt states his dog has not had rabies shots he has only had him 2 months
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
## 2020-09-16 ENCOUNTER — Encounter (HOSPITAL_COMMUNITY): Payer: Self-pay | Admitting: Emergency Medicine

## 2020-09-16 ENCOUNTER — Other Ambulatory Visit: Payer: Self-pay

## 2020-09-16 ENCOUNTER — Emergency Department (HOSPITAL_COMMUNITY)
Admission: EM | Admit: 2020-09-16 | Discharge: 2020-09-16 | Disposition: A | Discharge status: Home / Self Care | Payer: Self-pay | Attending: Emergency Medicine | Admitting: Emergency Medicine

## 2020-09-16 DIAGNOSIS — M25522 Pain in left elbow: Secondary | ICD-10-CM | POA: Insufficient documentation

## 2020-09-16 DIAGNOSIS — L03116 Cellulitis of left lower limb: Secondary | ICD-10-CM | POA: Insufficient documentation

## 2020-09-16 DIAGNOSIS — R2232 Localized swelling, mass and lump, left upper limb: Secondary | ICD-10-CM | POA: Insufficient documentation

## 2020-09-16 DIAGNOSIS — Z87448 Personal history of other diseases of urinary system: Secondary | ICD-10-CM | POA: Insufficient documentation

## 2020-09-16 DIAGNOSIS — N390 Urinary tract infection, site not specified: Secondary | ICD-10-CM | POA: Insufficient documentation

## 2020-09-16 LAB — CBC
HCT: 53.5 % — ABNORMAL HIGH (ref 39.0–52.0)
Hemoglobin: 17.2 g/dL — ABNORMAL HIGH (ref 13.0–17.0)
MCH: 28.2 pg (ref 26.0–34.0)
MCHC: 32.1 g/dL (ref 30.0–36.0)
MCV: 87.7 fL (ref 80.0–100.0)
Platelets: 322 10*3/uL (ref 150–400)
RBC: 6.1 MIL/uL — ABNORMAL HIGH (ref 4.22–5.81)
RDW: 13.2 % (ref 11.5–15.5)
WBC: 8.5 10*3/uL (ref 4.0–10.5)
nRBC: 0 % (ref 0.0–0.2)

## 2020-09-16 LAB — URINALYSIS, ROUTINE W REFLEX MICROSCOPIC
Bilirubin Urine: NEGATIVE
Glucose, UA: NEGATIVE mg/dL
Ketones, ur: NEGATIVE mg/dL
Nitrite: NEGATIVE
Protein, ur: 100 mg/dL — AB
RBC / HPF: >50 RBC/hpf — ABNORMAL HIGH (ref 0–5)
Specific Gravity, Urine: 1.017 (ref 1.005–1.030)
WBC, UA: >50 WBC/hpf — ABNORMAL HIGH (ref 0–5)
pH: 6 (ref 5.0–8.0)

## 2020-09-16 LAB — BASIC METABOLIC PANEL
Anion gap: 8 (ref 5–15)
BUN: 9 mg/dL (ref 6–20)
CO2: 26 mmol/L (ref 22–32)
Calcium: 9.3 mg/dL (ref 8.9–10.3)
Chloride: 102 mmol/L (ref 98–111)
Creatinine, Ser: 1.16 mg/dL (ref 0.61–1.24)
GFR, Estimated: >60 mL/min (ref >60)
Glucose, Bld: 82 mg/dL (ref 70–99)
Potassium: 4.2 mmol/L (ref 3.5–5.1)
Sodium: 136 mmol/L (ref 135–145)

## 2020-09-16 MED ORDER — CEPHALEXIN 500 MG PO CAPS: 500.0000 mg | ORAL_CAPSULE | Freq: Four times a day (QID) | ORAL | 0 refills | Status: AC
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
## 2020-09-16 NOTE — Discharge Instructions (Addendum)
Take antibiotics as prescribed.  Take entire course, even if symptoms improve. Use warm compresses to help with your leg pain and swelling. Make sure staying well-hydrated water. Use Tylenol or ibuprofen as needed for fever or discomfort. Follow-up with orthopedic doctor as needed for further evaluation of the left elbow. Return to the emergency room if you develop persistent fevers, severe vomiting, severe worsening abdominal pain, inability urinate, redness streaking up your leg, or any new, worsening, or concerning symptoms.

## 2020-09-16 NOTE — ED Triage Notes (Signed)
Patient is having hematuria that started this am, with lower back pain, also has sore on lower leg examined for infection, currently afebrile.

## 2020-11-11 ENCOUNTER — Emergency Department
Admission: EM | Admit: 2020-11-11 | Discharge: 2020-11-11 | Disposition: A | Discharge status: Home / Self Care | Payer: Self-pay | Attending: Emergency Medicine | Admitting: Emergency Medicine

## 2020-11-11 ENCOUNTER — Emergency Department: Payer: Self-pay

## 2020-11-11 ENCOUNTER — Other Ambulatory Visit: Payer: Self-pay

## 2020-11-11 ENCOUNTER — Encounter: Payer: Self-pay | Admitting: Emergency Medicine

## 2020-11-11 DIAGNOSIS — M25571 Pain in right ankle and joints of right foot: Secondary | ICD-10-CM | POA: Insufficient documentation

## 2020-11-11 DIAGNOSIS — Z5321 Procedure and treatment not carried out due to patient leaving prior to being seen by health care provider: Secondary | ICD-10-CM | POA: Insufficient documentation

## 2020-11-11 NOTE — ED Notes (Signed)
No answer when called several times from lobby 

## 2020-11-11 NOTE — ED Triage Notes (Signed)
Pt to triage via w/c with no distress noted; reports PTA twisted rt ankle and cont to have pain

## 2020-11-12 ENCOUNTER — Encounter: Payer: Self-pay | Admitting: Orthopedic Surgery

## 2020-11-12 ENCOUNTER — Telehealth: Payer: Self-pay | Admitting: Orthopedic Surgery

## 2020-11-12 ENCOUNTER — Ambulatory Visit: Payer: Self-pay

## 2020-11-12 ENCOUNTER — Ambulatory Visit (INDEPENDENT_AMBULATORY_CARE_PROVIDER_SITE_OTHER): Payer: Self-pay | Admitting: Orthopedic Surgery

## 2020-11-12 VITALS — BP 116/84 | HR 82 | Ht 68.0 in | Wt 160.0 lb

## 2020-11-12 DIAGNOSIS — M25571 Pain in right ankle and joints of right foot: Secondary | ICD-10-CM

## 2020-11-12 DIAGNOSIS — W010XXA Fall on same level from slipping, tripping and stumbling without subsequent striking against object, initial encounter: Secondary | ICD-10-CM

## 2020-11-12 DIAGNOSIS — S82831A Other fracture of upper and lower end of right fibula, initial encounter for closed fracture: Secondary | ICD-10-CM

## 2020-11-12 NOTE — Patient Instructions (Signed)
Instructions  1.  You have sustained an ankle sprain  2.  I encourage you to stay on your feet and gradually remove your walking boot.   3.  Below are some exercises that you can complete on your own to improve your symptoms.  4.  As an alternative, you can search for ankle sprain exercises online, and can see some demonstrations on YouTube  5.  If you are having difficulty with these exercises, we can also prescribe formal physical therapy  Ankle Exercises Ask your health care provider which exercises are safe for you. Do exercises exactly as told by your health care provider and adjust them as directed. It is normal to feel mild stretching, pulling, tightness, or mild discomfort as you do these exercises. Stop right away if you feel sudden pain or your pain gets worse. Do not begin these exercises until told by your health care provider.  Stretching and range-of-motion exercises These exercises warm up your muscles and joints and improve the movement and flexibility of your ankle. These exercises may also help to relieve pain.  Dorsiflexion/plantar flexion  1. Sit with your R knee straight or bent. Do not rest your foot on anything. 2. Flex your left ankle to tilt the top of your foot toward your shin. This is called dorsiflexion. 3. Hold this position for 5 seconds. 4. Point your toes downward to tilt the top of your foot away from your shin. This is called plantar flexion. 5. Hold this position for 5 seconds. Repeat 10 times. Complete this exercise 2-3 times a day.  As tolerated  Ankle alphabet  1. Sit with your R foot supported at your lower leg. ? Do not rest your foot on anything. ? Make sure your foot has room to move freely. 2. Think of your R foot as a paintbrush: ? Move your foot to trace each letter of the alphabet in the air. Keep your hip and knee still while you trace the letters. Trace every letter from A to Z. ? Make the letters as large as you can without causing or  increasing any discomfort.  Repeat 2-3 times. Complete this exercise 2-3 times a day.   Strengthening exercises These exercises build strength and endurance in your ankle. Endurance is the ability to use your muscles for a long time, even after they get tired. Dorsiflexors These are muscles that lift your foot up. 1. Secure a rubber exercise band or tube to an object, such as a table leg, that will stay still when the band is pulled. Secure the other end around your R foot. 2. Sit on the floor, facing the object with your R leg extended. The band or tube should be slightly tense when your foot is relaxed. 3. Slowly flex your R ankle and toes to bring your foot toward your shin. 4. Hold this position for 5 seconds. 5. Slowly return your foot to the starting position, controlling the band as you do that. Repeat 10 times. Complete this exercise 2-3 times a day.  Plantar flexors These are muscles that push your foot down. 1. Sit on the floor with your R leg extended. 2. Loop a rubber exercise band or tube around the ball of your R foot. The ball of your foot is on the walking surface, right under your toes. The band or tube should be slightly tense when your foot is relaxed. 3. Slowly point your toes downward, pushing them away from you. 4. Hold this position for 5  seconds. 5. Slowly release the tension in the band or tube, controlling smoothly until your foot is back in the starting position. Repeat 10 times. Complete this exercise 2-3 times a day.  Towel curls  1. Sit in a chair on a non-carpeted surface, and put your feet on the floor. 2. Place a towel in front of your feet. 3. Keeping your heel on the floor, put your R foot on the towel. 4. Pull the towel toward you by grabbing the towel with your toes and curling them under. Keep your heel on the floor. 5. Let your toes relax. 6. Grab the towel again. Keep pulling the towel until it is completely underneath your foot. Repeat 10  times. Complete this exercise 2-3 times a day.  Standing plantar flexion This is an exercise in which you use your toes to lift your body's weight while standing. 1. Stand with your feet shoulder-width apart. 2. Keep your weight spread evenly over the width of your feet while you rise up on your toes. Use a wall or table to steady yourself if needed, but try not to use it for support. 3. If this exercise is too easy, try these options: ? Shift your weight toward your R leg until you feel challenged. ? If told by your health care provider, lift your uninjured leg off the floor. 4. Hold this position for 5 seconds. Repeat 10 times. Complete this exercise 2-3 times a day.  Tandem walking 1. Stand with one foot directly in front of the other. 2. Slowly raise your back foot up, lifting your heel before your toes, and place it directly in front of your other foot. 3. Continue to walk in this heel-to-toe way. Have a countertop or wall nearby to use if needed to keep your balance, but try not to hold onto anything for support.  Repeat 10 times. Complete this exercise 2-3 times a day.   Document Revised: 08/05/2018 Document Reviewed: 08/07/2018 Elsevier Patient Education  2020 [employer]   

## 2020-11-12 NOTE — Progress Notes (Signed)
New Patient Visit  Assessment: Steven Lloyd is a 44 y.o. male with the following: Right distal fibula fracture; minimally displaced; ankle stable  Plan: Mr. Sahakian sustained a minimally displaced fracture to the right distal fibula.  The rest of his ankle remains stable, without additional injury.  As result, we can treat this without surgery.  He was fitted for a walking boot in clinic today, and told he can continue with weightbearing as tolerated.  He was also given some crutches, to assist with ambulation at this time.  He was given ankle sprain exercises, to continue working on his range of motion.  Anticipate that he will do well, and his pain will gradually increase.  Advised him to elevate his leg is much as possible.  All questions were answered and he is amenable to this plan.  We will see him in 4 weeks.   Follow-up: Return in about 4 weeks (around 12/10/2020).  X-ray of the right ankle.  Subjective:  Chief Complaint  Patient presents with  . Ankle Injury    Had a fall on 11/10/2020. 9/10 today pain,.     History of Present Illness: Steven Lloyd is a 44 y.o. male who presents for evaluation of his right ankle.  He states he slipped and fell coming out of his house 2 days ago.  He twisted his ankle, and also sustained an injury to the left part of his face.  He was evaluated yesterday at an urgent care center, and advised to follow-up in clinic today.  He did have x-rays done there, and was told he has a fracture.  He could not afford a walking boot at that time.  He continues to have significant pain in the right ankle, and is struggling to ambulate.  He has been taking Tylenol for pain with good relief.   Review of Systems: No fevers or chills No numbness or tingling No chest pain No shortness of breath No bowel or bladder dysfunction No GI distress No headaches   Medical History:  Past Medical History:  Diagnosis Date  . Depression   . Prostatitis      Past Surgical History:  Procedure Laterality Date  . APPENDECTOMY    . CRANIECTOMY FOR DEPRESSED SKULL FRACTURE N/A 02/11/2018   Procedure: ELEVATION CRANIECTOMY FOR DEPRESSED SKULL FRACTURE, Repair of linear scalp lesion;  Surgeon: Coletta Memos, MD;  Location: [facility] OR;  Service: Neurosurgery;  Laterality: N/A;    Family History  Problem Relation Age of Onset  . Diabetes Mother   . Heart attack Father   . Autism Son   . Hyperthyroidism Sister   . Healthy Brother   . Healthy Brother    Social History   Tobacco Use  . Smoking status: Never Smoker  . Smokeless tobacco: Never Used  Vaping Use  . Vaping Use: Never used  Substance Use Topics  . Alcohol use: Not Currently    Comment: occasionally  . Drug use: Yes    Types: Marijuana    Comment: x 2 days ago    Allergies  Allergen Reactions  . Penicillins Rash    .Did it involve swelling of the face/tongue/throat, SOB, or low BP? Unknown Did it involve sudden or severe rash/hives, skin peeling, or any reaction on the inside of your mouth or nose? Unknown Did you need to seek medical attention at a hospital or doctor's office? Unknown When did it last happen?Childhood allergy If all above answers are "NO", may proceed with cephalosporin  use.     Current Meds  Medication Sig  . cetirizine (ZYRTEC ALLERGY) 10 MG tablet Take 1 tablet (10 mg total) by mouth daily.  . [employer] ECHINACEA PO Take 1 tablet by mouth daily.  . OMEGA-3 FATTY ACIDS PO Take 1 capsule by mouth daily.  . vitamin C (ASCORBIC ACID) 500 MG tablet Take 500 mg by mouth daily.  Marland Kitchen VITAMIN D, ERGOCALCIFEROL, PO Take 1 capsule by mouth daily.  Carlena Hurl 20 MG TABS tablet TAKE 1 TABLET(20 MG) BY MOUTH DAILY  . zinc sulfate 220 (50 Zn) MG capsule Take 220 mg by mouth daily.    Objective: BP 116/84   Pulse 82   Ht 5\' 8"  (1.727 m)   Wt 160 lb (72.6 kg)   BMI 24.33 kg/m   Physical Exam:  General: Alert and oriented, no acute distress.  He has bruising  around his left eye. Gait: Right-sided antalgic gait  Evaluation the right ankle demonstrates significant swelling and ecchymosis over the lateral aspect of the ankle.  He has no tenderness medially.  Sensation is intact over the dorsum of his foot.  He does tolerate 10 degrees of dorsiflexion.  Toes are warm and well-perfused.    IMAGING: I personally ordered and reviewed the following images   X-rays of the right ankle were obtained in clinic today and demonstrates a minimally displaced oblique fracture of the distal fibula.  The mortise remains intact.  There is no medial clear space widening.  Impression: Minimally displaced right distal fibula fracture   New Medications:  No orders of the defined types were placed in this encounter.     , MD  11/12/2020 12:15 PM

## 2020-11-12 NOTE — Telephone Encounter (Signed)
Patient seen as scheduled. No new notes.

## 2020-12-10 ENCOUNTER — Encounter: Payer: Self-pay | Admitting: Orthopedic Surgery

## 2020-12-10 ENCOUNTER — Ambulatory Visit: Payer: Self-pay | Admitting: Orthopedic Surgery

## 2021-09-08 ENCOUNTER — Emergency Department (HOSPITAL_COMMUNITY): Payer: Self-pay

## 2021-09-08 ENCOUNTER — Encounter (HOSPITAL_COMMUNITY): Payer: Self-pay

## 2021-09-08 ENCOUNTER — Other Ambulatory Visit: Payer: Self-pay

## 2021-09-08 ENCOUNTER — Emergency Department (HOSPITAL_COMMUNITY)
Admission: EM | Admit: 2021-09-08 | Discharge: 2021-09-08 | Disposition: A | Discharge status: Home / Self Care | Payer: Self-pay | Attending: Emergency Medicine | Admitting: Emergency Medicine

## 2021-09-08 DIAGNOSIS — R10A0 Flank pain, unspecified side: Secondary | ICD-10-CM

## 2021-09-08 DIAGNOSIS — R109 Unspecified abdominal pain: Secondary | ICD-10-CM

## 2021-09-08 DIAGNOSIS — N3001 Acute cystitis with hematuria: Secondary | ICD-10-CM

## 2021-09-08 LAB — COMPREHENSIVE METABOLIC PANEL
ALT: 16 U/L (ref 0–44)
AST: 23 U/L (ref 15–41)
Albumin: 3.7 g/dL (ref 3.5–5.0)
Alkaline Phosphatase: 74 U/L (ref 38–126)
Anion gap: 6 (ref 5–15)
BUN: 12 mg/dL (ref 6–20)
CO2: 28 mmol/L (ref 22–32)
Calcium: 8.5 mg/dL — ABNORMAL LOW (ref 8.9–10.3)
Chloride: 103 mmol/L (ref 98–111)
Creatinine, Ser: 1.31 mg/dL — ABNORMAL HIGH (ref 0.61–1.24)
GFR, Estimated: >60 mL/min (ref >60)
Glucose, Bld: 99 mg/dL (ref 70–99)
Potassium: 4.1 mmol/L (ref 3.5–5.1)
Sodium: 137 mmol/L (ref 135–145)
Total Bilirubin: 0.8 mg/dL (ref 0.3–1.2)
Total Protein: 6.9 g/dL (ref 6.5–8.1)

## 2021-09-08 LAB — URINALYSIS, ROUTINE W REFLEX MICROSCOPIC
Bilirubin Urine: NEGATIVE
Glucose, UA: NEGATIVE mg/dL
Ketones, ur: NEGATIVE mg/dL
Nitrite: NEGATIVE
Protein, ur: NEGATIVE mg/dL
Specific Gravity, Urine: 1.02 (ref 1.005–1.030)
pH: 6 (ref 5.0–8.0)

## 2021-09-08 LAB — CBC WITH DIFFERENTIAL/PLATELET
Abs Immature Granulocytes: 0 10*3/uL (ref 0.00–0.07)
Basophils Absolute: 0 10*3/uL (ref 0.0–0.1)
Basophils Relative: 1 %
Eosinophils Absolute: 0.1 10*3/uL (ref 0.0–0.5)
Eosinophils Relative: 1 %
HCT: 50.1 % (ref 39.0–52.0)
Hemoglobin: 16.4 g/dL (ref 13.0–17.0)
Immature Granulocytes: 0 %
Lymphocytes Relative: 30 %
Lymphs Abs: 1.3 10*3/uL (ref 0.7–4.0)
MCH: 28.7 pg (ref 26.0–34.0)
MCHC: 32.7 g/dL (ref 30.0–36.0)
MCV: 87.7 fL (ref 80.0–100.0)
Monocytes Absolute: 0.5 10*3/uL (ref 0.1–1.0)
Monocytes Relative: 11 %
Neutro Abs: 2.5 10*3/uL (ref 1.7–7.7)
Neutrophils Relative %: 57 %
Platelets: 309 10*3/uL (ref 150–400)
RBC: 5.71 MIL/uL (ref 4.22–5.81)
RDW: 13.1 % (ref 11.5–15.5)
WBC: 4.4 10*3/uL (ref 4.0–10.5)
nRBC: 0 % (ref 0.0–0.2)

## 2021-09-08 LAB — URINALYSIS, MICROSCOPIC (REFLEX)

## 2021-09-08 MED ORDER — SULFAMETHOXAZOLE-TRIMETHOPRIM 800-160 MG PO TABS: 1.0000 | ORAL_TABLET | Freq: Two times a day (BID) | ORAL | 0 refills | Status: AC

## 2021-09-08 MED ORDER — TRAMADOL HCL 50 MG PO TABS: 50.0000 mg | ORAL_TABLET | Freq: Four times a day (QID) | ORAL | 0 refills | Status: DC | PRN

## 2021-09-08 MED ORDER — IBUPROFEN 600 MG PO TABS: 600.0000 mg | ORAL_TABLET | Freq: Four times a day (QID) | ORAL | 0 refills | Status: DC | PRN

## 2021-09-08 NOTE — Discharge Instructions (Addendum)
As discussed you do not have an normal urinalysis as there are some white blood cells and some red blood cells along with a few bacteria.  A urine culture has been sent, but you are being covered with an antibiotic today.  Take the entire course of this medication.  Some of your exam, specifically your back pain which is worse with palpation and movement suggesting musculoskeletal source.  You may take ibuprofen as prescribed, also prescribed a few tramadol tablets which is a mild narcotic pain reliever.  This medication may make you drowsy so do not drive within 4 hours of taking it.  You may also use a heating pad applied to your back for 20 minutes several times daily.  Call Dr. Allena Katz to establish care with him.  Get follow-up care for any worsening symptoms.
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
## 2021-09-08 NOTE — ED Triage Notes (Signed)
Left mid back pain that radiates down to left lower flank x 3 days, denies injury. No hx of stones. States some discomfort when urinated but no other sx. Denies fever.

## 2021-09-08 NOTE — ED Notes (Signed)
Pt transported to CT ?

## 2022-02-11 ENCOUNTER — Telehealth: Payer: Self-pay | Admitting: Orthopedic Surgery
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
## 2022-02-13 ENCOUNTER — Other Ambulatory Visit: Payer: Self-pay

## 2022-02-13 ENCOUNTER — Encounter (HOSPITAL_COMMUNITY): Payer: Self-pay

## 2022-02-13 ENCOUNTER — Emergency Department (HOSPITAL_COMMUNITY): Payer: Self-pay

## 2022-02-13 ENCOUNTER — Emergency Department (HOSPITAL_COMMUNITY)
Admission: EM | Admit: 2022-02-13 | Discharge: 2022-02-14 | Disposition: A | Discharge status: Home / Self Care | Payer: Self-pay | Attending: Emergency Medicine | Admitting: Emergency Medicine

## 2022-02-13 DIAGNOSIS — M79642 Pain in left hand: Secondary | ICD-10-CM | POA: Insufficient documentation

## 2022-02-13 DIAGNOSIS — S71131A Puncture wound without foreign body, right thigh, initial encounter: Secondary | ICD-10-CM | POA: Insufficient documentation

## 2022-02-13 DIAGNOSIS — Z23 Encounter for immunization: Secondary | ICD-10-CM | POA: Insufficient documentation

## 2022-02-13 MED ORDER — TETANUS-DIPHTH-ACELL PERTUSSIS 5-2.5-18.5 LF-MCG/0.5 IM SUSY
0.5000 mL | PREFILLED_SYRINGE | Freq: Once | INTRAMUSCULAR | Status: AC
  Administered 2022-02-13: 0.5 mL via INTRAMUSCULAR
  Filled 2022-02-13: qty 0.5
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
## 2022-02-14 MED ORDER — ACETAMINOPHEN 500 MG PO TABS
1000.0000 mg | ORAL_TABLET | Freq: Once | ORAL | Status: AC
  Administered 2022-02-14: 1000 mg via ORAL
  Filled 2022-02-14: qty 2
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
## 2022-02-26 ENCOUNTER — Encounter (HOSPITAL_COMMUNITY): Payer: Self-pay | Admitting: *Deleted

## 2022-02-26 ENCOUNTER — Emergency Department (HOSPITAL_COMMUNITY)
Admission: EM | Admit: 2022-02-26 | Discharge: 2022-02-26 | Disposition: A | Discharge status: Home / Self Care | Payer: Self-pay | Attending: Emergency Medicine | Admitting: Emergency Medicine

## 2022-02-26 DIAGNOSIS — Z4802 Encounter for removal of sutures: Secondary | ICD-10-CM | POA: Insufficient documentation

## 2022-02-26 NOTE — Discharge Instructions (Signed)
Wound appears to be healing well.  Keep the area bandaged as needed. ?

## 2022-02-26 NOTE — ED Triage Notes (Signed)
Staple removal right side ?

## 2022-02-26 NOTE — ED Provider Notes (Signed)
?[facility] ?Provider Note ? ? ?CSN: [id number] ?Arrival date & time: 02/26/22  1505 ? ?  ? ?History ? ?Chief Complaint  ?Patient presents with  ? Suture / Staple Removal  ? ? ?Steven Lloyd is a 46 y.o. male. ? ? ?Suture / Staple Removal ?Pertinent negatives include no chest pain, no abdominal pain and no shortness of breath.  ? ?  ? ? ?Steven Lloyd is a 46 y.o. male who presents to the Emergency Department requesting staple removal.  He seen here on 02/13/2022 and treated for a puncture wound to the right thigh in which 1 staple was placed.  He states area is healing well and he denies any swelling redness or drainage at the site. ? ? ?Home Medications ?Prior to Admission medications   ?Medication Sig Start Date End Date Taking? Authorizing Provider  ?cetirizine (ZYRTEC ALLERGY) 10 MG tablet Take 1 tablet (10 mg total) by mouth daily. 06/06/19   Orpah Greek, MD  ?[facility] ECHINACEA PO Take 1 tablet by mouth daily.    [provider]  ?ibuprofen (ADVIL) 600 MG tablet Take 1 tablet (600 mg total) by mouth every 6 (six) hours as needed. 09/08/21   Evalee Jefferson, PA-C  ?OMEGA-3 FATTY ACIDS PO Take 1 capsule by mouth daily.    [provider]  ?traMADol (ULTRAM) 50 MG tablet Take 1 tablet (50 mg total) by mouth every 6 (six) hours as needed. 09/08/21   Evalee Jefferson, PA-C  ?vitamin C (ASCORBIC ACID) 500 MG tablet Take 500 mg by mouth daily.    [provider]  ?VITAMIN D, ERGOCALCIFEROL, PO Take 1 capsule by mouth daily.    [provider]  ?zinc sulfate 220 (50 Zn) MG capsule Take 220 mg by mouth daily.    [provider]  ?   ? ?Allergies    ?Penicillins   ? ?Review of Systems   ?Review of Systems  ?Constitutional:  Negative for chills and fever.  ?Respiratory:  Negative for shortness of breath.   ?Cardiovascular:  Negative for chest pain.  ?Gastrointestinal:  Negative for abdominal pain, nausea and vomiting.  ?Skin:   ?     Staple at right  upper thigh  ?Neurological:  Negative for weakness and numbness.  ?All other systems reviewed and are negative. ? ?Physical Exam ?Updated Vital Signs ?BP 127/74 (BP Location: Left Arm)   Pulse 78   Temp 97.8 ?F (36.6 ?C) (Oral)   Resp 17   SpO2 100%  ?Physical Exam ?Vitals and nursing note reviewed.  ?Constitutional:   ?   Appearance: Normal appearance.  ?Cardiovascular:  ?   Rate and Rhythm: Normal rate and regular rhythm.  ?   Pulses: Normal pulses.  ?Pulmonary:  ?   Effort: Pulmonary effort is normal.  ?   Breath sounds: Normal breath sounds.  ?Musculoskeletal:     ?   General: No swelling or tenderness. Normal range of motion.  ?   Cervical back: Normal range of motion.  ?Skin: ?   General: Skin is warm.  ?   Capillary Refill: Capillary refill takes less than 2 seconds.  ?   Findings: No bruising or erythema.  ?   Comments: Single staple lateral right upper thigh.  No surrounding erythema, drainage or edema.  Laceration appears well-healed  ?Neurological:  ?   General: No focal deficit present.  ?   Mental Status: He is alert.  ?   Sensory: No sensory deficit.  ? ? ?  ED Results / Procedures / Treatments   ?Labs ?(all labs ordered are listed, but only abnormal results are displayed) ?Labs Reviewed - No data to display ? ?EKG ?None ? ?Radiology ?No results found. ? ?Procedures ?Procedures  ? ? ? ?SUTURE REMOVAL ?Performed by: Cresta Riden ? ?Consent: Verbal consent obtained. ?Consent given by: patient ?Required items: required blood products, implants, devices, and special equipment available ?Time out: Immediately prior to procedure a "time out" was called to verify the correct patient, procedure, equipment, support staff and site/side marked as required. ? ?Location: right lateral thigh ? ?Wound Appearance: clean ? ?Sutures/Staples Removed: 1 ? ?Patient tolerance: Patient tolerated the procedure well with no immediate complications. ? ? ?  ?Medications Ordered in ED ?Medications - No data to display ? ?ED  Course/ Medical Decision Making/ A&P ?  ?                        ?Medical Decision Making ? ?Patient here for staple removal.  Had puncture wound of the lateral right thigh area closed with staple on 02/13/2022.  Wound appears to be healing well.  No clinical evidence of infection.  No wound dehiscence. ? ? ?Staple successfully removed by me without difficulty.  Wound care instructions discussed. ? ? ? ? ? ? ? ?Final Clinical Impression(s) / ED Diagnoses ?Final diagnoses:  ?Encounter for staple removal  ? ? ?Rx / DC Orders ?ED Discharge Orders   ? ? None  ? ?  ? ? ?  ?Kem Parkinson, PA-C ?02/26/22 1710 ? ?  ?Milton Ferguson, MD ?02/28/22 1250 ? ?
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
## 2023-01-17 ENCOUNTER — Encounter (HOSPITAL_COMMUNITY): Payer: Self-pay | Admitting: Emergency Medicine

## 2023-01-17 ENCOUNTER — Emergency Department (HOSPITAL_COMMUNITY)
Admission: EM | Admit: 2023-01-17 | Discharge: 2023-01-17 | Disposition: A | Discharge status: Home / Self Care | Payer: Self-pay | Attending: Emergency Medicine | Admitting: Emergency Medicine

## 2023-01-17 DIAGNOSIS — R197 Diarrhea, unspecified: Secondary | ICD-10-CM | POA: Insufficient documentation

## 2023-01-17 DIAGNOSIS — R319 Hematuria, unspecified: Secondary | ICD-10-CM | POA: Insufficient documentation

## 2023-01-17 DIAGNOSIS — A64 Unspecified sexually transmitted disease: Secondary | ICD-10-CM | POA: Insufficient documentation

## 2023-01-17 LAB — COMPREHENSIVE METABOLIC PANEL
ALT: 15 U/L (ref 0–44)
AST: 17 U/L (ref 15–41)
Albumin: 3.6 g/dL (ref 3.5–5.0)
Alkaline Phosphatase: 86 U/L (ref 38–126)
Anion gap: 11 (ref 5–15)
BUN: 15 mg/dL (ref 6–20)
CO2: 27 mmol/L (ref 22–32)
Calcium: 8.8 mg/dL — ABNORMAL LOW (ref 8.9–10.3)
Chloride: 99 mmol/L (ref 98–111)
Creatinine, Ser: 1.23 mg/dL (ref 0.61–1.24)
GFR, Estimated: >60 mL/min (ref >60)
Glucose, Bld: 92 mg/dL (ref 70–99)
Potassium: 4.2 mmol/L (ref 3.5–5.1)
Sodium: 137 mmol/L (ref 135–145)
Total Bilirubin: 0.7 mg/dL (ref 0.3–1.2)
Total Protein: 6.8 g/dL (ref 6.5–8.1)

## 2023-01-17 LAB — URINALYSIS, ROUTINE W REFLEX MICROSCOPIC
Bilirubin Urine: NEGATIVE
Glucose, UA: NEGATIVE mg/dL
Ketones, ur: NEGATIVE mg/dL
Nitrite: POSITIVE — AB
Protein, ur: >=300 mg/dL — AB
RBC / HPF: >50 RBC/hpf (ref 0–5)
Specific Gravity, Urine: 1.017 (ref 1.005–1.030)
Trans Epithel, UA: 2
WBC, UA: >50 WBC/hpf (ref 0–5)
pH: 6 (ref 5.0–8.0)

## 2023-01-17 LAB — CBC WITH DIFFERENTIAL/PLATELET
Abs Immature Granulocytes: 0.03 10*3/uL (ref 0.00–0.07)
Basophils Absolute: 0.1 10*3/uL (ref 0.0–0.1)
Basophils Relative: 1 %
Eosinophils Absolute: 0.1 10*3/uL (ref 0.0–0.5)
Eosinophils Relative: 1 %
HCT: 48.4 % (ref 39.0–52.0)
Hemoglobin: 15.3 g/dL (ref 13.0–17.0)
Immature Granulocytes: 0 %
Lymphocytes Relative: 20 %
Lymphs Abs: 2.1 10*3/uL (ref 0.7–4.0)
MCH: 28.6 pg (ref 26.0–34.0)
MCHC: 31.6 g/dL (ref 30.0–36.0)
MCV: 90.5 fL (ref 80.0–100.0)
Monocytes Absolute: 1 10*3/uL (ref 0.1–1.0)
Monocytes Relative: 10 %
Neutro Abs: 6.8 10*3/uL (ref 1.7–7.7)
Neutrophils Relative %: 68 %
Platelets: 254 10*3/uL (ref 150–400)
RBC: 5.35 MIL/uL (ref 4.22–5.81)
RDW: 12.8 % (ref 11.5–15.5)
WBC: 10.1 10*3/uL (ref 4.0–10.5)
nRBC: 0 % (ref 0.0–0.2)

## 2023-01-17 LAB — LIPASE, BLOOD: Lipase: 43 U/L (ref 11–51)

## 2023-01-17 MED ORDER — DOXYCYCLINE HYCLATE 100 MG PO CAPS: 100.0000 mg | ORAL_CAPSULE | Freq: Two times a day (BID) | ORAL | 0 refills | Status: DC

## 2023-01-17 MED ORDER — FAMOTIDINE 20 MG PO TABS: 20.0000 mg | ORAL_TABLET | Freq: Two times a day (BID) | ORAL | 0 refills | Status: DC

## 2023-01-17 MED ORDER — LOPERAMIDE HCL 2 MG PO CAPS: 2.0000 mg | ORAL_CAPSULE | Freq: Four times a day (QID) | ORAL | 0 refills | Status: DC | PRN

## 2023-01-17 MED ORDER — FAMOTIDINE 20 MG PO TABS
20.0000 mg | ORAL_TABLET | Freq: Once | ORAL | Status: AC
  Administered 2023-01-17: 20 mg via ORAL
  Filled 2023-01-17: qty 1

## 2023-01-17 MED ORDER — CEFTRIAXONE SODIUM 500 MG IJ SOLR
500.0000 mg | Freq: Once | INTRAMUSCULAR | Status: AC
  Administered 2023-01-17: 500 mg via INTRAMUSCULAR
  Filled 2023-01-17: qty 500

## 2023-01-17 MED ORDER — LIDOCAINE HCL (PF) 1 % IJ SOLN
INTRAMUSCULAR | Status: AC
  Administered 2023-01-17: 1 mL
  Filled 2023-01-17: qty 2

## 2023-01-17 NOTE — Discharge Instructions (Signed)
Please take your medications as prescribed. I recommend close follow-up with PCP for reevaluation.  Please do not hesitate to return to emergency department if worrisome signs symptoms we discussed become apparent.

## 2023-01-17 NOTE — ED Provider Notes (Signed)
[facility] Provider Note   CSN: [id number] Arrival date & time: 01/17/23  1814     History  Chief Complaint  Patient presents with   Diarrhea    Steven Lloyd is a 47 y.o. male with a past medical history of prostatitis presenting today for evaluation of diarrhea and abdominal pain.  Patient reports multiple episodes of nonbloody diarrhea since yesterday.  Patient reports drinking heavily prior to onset of symptoms.  Pain is across his abdomen.  Denies fever, chest pain, shortness of breath.  States he has been seen blood in his urine and blood on wipe after bowel movement.   Diarrhea   Past Medical History:  Diagnosis Date   Depression    Prostatitis    Past Surgical History:  Procedure Laterality Date   APPENDECTOMY     CRANIECTOMY FOR DEPRESSED SKULL FRACTURE N/A 02/11/2018   Procedure: ELEVATION CRANIECTOMY FOR DEPRESSED SKULL FRACTURE, Repair of linear scalp lesion;  Surgeon: Ashok Pall, MD;  Location: [place];  Service: Neurosurgery;  Laterality: N/A;     Home Medications Prior to Admission medications   Medication Sig Start Date End Date Taking? Authorizing Provider  doxycycline (VIBRAMYCIN) 100 MG capsule Take 1 capsule (100 mg total) by mouth 2 (two) times daily. 01/17/23  Yes Rex Kras, PA  loperamide (IMODIUM) 2 MG capsule Take 1 capsule (2 mg total) by mouth 4 (four) times daily as needed for diarrhea or loose stools. 01/17/23  Yes Rex Kras, PA  cetirizine (ZYRTEC ALLERGY) 10 MG tablet Take 1 tablet (10 mg total) by mouth daily. 06/06/19   Orpah Greek, MD  [facility] ECHINACEA PO Take 1 tablet by mouth daily.    [provider]  ibuprofen (ADVIL) 600 MG tablet Take 1 tablet (600 mg total) by mouth every 6 (six) hours as needed. 09/08/21   Evalee Jefferson, PA-C  OMEGA-3 FATTY ACIDS PO Take 1 capsule by mouth daily.    [provider]  traMADol (ULTRAM) 50 MG tablet Take 1 tablet (50 mg total) by mouth  every 6 (six) hours as needed. 09/08/21   Evalee Jefferson, PA-C  vitamin C (ASCORBIC ACID) 500 MG tablet Take 500 mg by mouth daily.    [provider]  VITAMIN D, ERGOCALCIFEROL, PO Take 1 capsule by mouth daily.    [provider]  zinc sulfate 220 (50 Zn) MG capsule Take 220 mg by mouth daily.    [provider]      Allergies    Penicillins    Review of Systems   Review of Systems  Gastrointestinal:  Positive for diarrhea.    Physical Exam Updated Vital Signs BP 122/73 (BP Location: Right Arm)   Pulse 86   Temp 98.4 F (36.9 C) (Oral)   Resp 20   SpO2 100%  Physical Exam Vitals and nursing note reviewed.  Constitutional:      Appearance: Normal appearance.  HENT:     Head: Normocephalic and atraumatic.     Mouth/Throat:     Mouth: Mucous membranes are moist.  Eyes:     General: No scleral icterus. Cardiovascular:     Rate and Rhythm: Normal rate and regular rhythm.     Pulses: Normal pulses.     Heart sounds: Normal heart sounds.  Pulmonary:     Effort: Pulmonary effort is normal.     Breath sounds: Normal breath sounds.  Abdominal:     General: Abdomen is flat.  Palpations: Abdomen is soft.     Tenderness: There is no abdominal tenderness.  Musculoskeletal:        General: No deformity.  Skin:    General: Skin is warm.     Findings: No rash.  Neurological:     General: No focal deficit present.     Mental Status: He is alert.  Psychiatric:        Mood and Affect: Mood normal.     ED Results / Procedures / Treatments   Labs (all labs ordered are listed, but only abnormal results are displayed) Labs Reviewed  COMPREHENSIVE METABOLIC PANEL - Abnormal; Notable for the following components:      Result Value   Calcium 8.8 (*)    All other components within normal limits  URINALYSIS, ROUTINE W REFLEX MICROSCOPIC - Abnormal; Notable for the following components:   APPearance CLOUDY (*)    Hgb urine dipstick LARGE (*)     Protein, ur >=300 (*)    Nitrite POSITIVE (*)    Leukocytes,Ua MODERATE (*)    Bacteria, UA RARE (*)    All other components within normal limits  LIPASE, BLOOD  CBC WITH DIFFERENTIAL/PLATELET  GC/CHLAMYDIA PROBE AMP ([place]) NOT AT [facility]    EKG None  Radiology No results found.  Procedures Procedures    Medications Ordered in ED Medications  cefTRIAXone (ROCEPHIN) injection 500 mg (has no administration in time range)  lidocaine (PF) (XYLOCAINE) 1 % injection (has no administration in time range)  famotidine (PEPCID) tablet 20 mg (20 mg Oral Given 01/17/23 2128)    ED Course/ Medical Decision Making/ A&P                             Medical Decision Making Amount and/or Complexity of Data Reviewed Labs: ordered.  Risk Prescription drug management.   This patient presents to the ED for diarrhea, hematuria, this involves an extensive number of treatment options, and is a complaint that carries with a high risk of complications and morbidity.  The differential diagnosis includes gastritis, enteritis, diverticulosis, appendicitis, UTI, STD, infectious etiology.  This is not an exhaustive list.  Lab tests: I ordered and personally interpreted labs.  The pertinent results include: WBC unremarkable. Hbg unremarkable. Platelets unremarkable. Electrolytes unremarkable. BUN, creatinine unremarkable. UA significant for leukocytes, nitrites and bacteria.  Problem list/ ED course/ Critical interventions/ Medical management: HPI: See above Vital signs within normal range and stable throughout visit. Laboratory/imaging studies significant for: See above. On physical examination, patient is afebrile and appears in no acute distress. This patient presents with symptoms consistent with sti. No systemic symptoms. Not septic. Well appearing. Low suspicion for acute pyelonephritis given lack of fever, CVAT, or systemic features. Low suspicion for kidney stone or infected stone.  Based  on patient's clinical presentations and laboratory/imaging studies I suspect STI.  I ordered ceftriaxone and doxycycline to cover GC/chlamydia.  Advised patient to take his medication as prescribed, follow-up with PCP for further evaluation and management, return to the ER if new or worsening symptoms.   I have reviewed the patient home medicines and have made adjustments as needed.  Cardiac monitoring/EKG: The patient was maintained on a cardiac monitor.  I personally reviewed and interpreted the cardiac monitor which showed an underlying rhythm of: sinus rhythm.  Additional history obtained: External records from outside source obtained and reviewed including: Chart review including previous notes, labs, imaging.  Disposition Continued outpatient therapy. Follow-up  with PCP recommended for reevaluation of symptoms. Treatment plan discussed with patient.  Pt acknowledged understanding was agreeable to the plan. Worrisome signs and symptoms were discussed with patient, and patient acknowledged understanding to return to the ED if they noticed these signs and symptoms. Patient was stable upon discharge.   This chart was dictated using voice recognition software.  Despite best efforts to proofread,  errors can occur which can change the documentation meaning.          Final Clinical Impression(s) / ED Diagnoses Final diagnoses:  STD (male)  Diarrhea, unspecified type    Rx / DC Orders ED Discharge Orders          Ordered    doxycycline (VIBRAMYCIN) 100 MG capsule  2 times daily        01/17/23 2227    loperamide (IMODIUM) 2 MG capsule  4 times daily PRN        01/17/23 2227              Jeanelle Malling, [place] 01/17/23 2333    Eber Hong, MD 03/03/23 [phone]

## 2023-01-17 NOTE — ED Triage Notes (Addendum)
Pt reports diarrhea this morning. He states he has had it all day. He has been drinking Pedialyte. Has not taken imodium or anything to stop it. Pt appears well and to be in no distress. Pt states he just got back from beach where he was intaking ETOH heavily.

## 2023-01-17 NOTE — ED Notes (Signed)
Per pt request, password changed to [email or web address] account.

## 2023-01-17 NOTE — ED Notes (Addendum)
Pt in the bathroom  

## 2023-01-19 LAB — GC/CHLAMYDIA PROBE AMP (~~LOC~~) NOT AT ARMC
Chlamydia: POSITIVE — AB
Comment: NEGATIVE
Comment: NORMAL
Neisseria Gonorrhea: POSITIVE — AB

## 2023-01-20 ENCOUNTER — Encounter: Payer: Self-pay | Admitting: Radiology

## 2023-01-20 LAB — URINE CULTURE: Culture: >=100000 — AB

## 2023-01-21 ENCOUNTER — Telehealth (HOSPITAL_BASED_OUTPATIENT_CLINIC_OR_DEPARTMENT_OTHER): Payer: Self-pay

## 2023-01-21 NOTE — Telephone Encounter (Signed)
Post ED Visit - Positive Culture Follow-up: Unsuccessful Patient Follow-up  Culture assessed and recommendations reviewed by:  '[x]'$  Lorin Gloriann Loan, Pharm.D. '[]'$  Heide Guile, Pharm.D., BCPS AQ-ID '[]'$  Parks Neptune, Pharm.D., BCPS '[]'$  Alycia Rossetti, Pharm.D., BCPS '[]'$  [place], [place].D., BCPS, AAHIVP '[]'$  Legrand Como, Pharm.D., BCPS, AAHIVP '[]'$  Wynell Balloon, PharmD '[]'$  Vincenza Hews, PharmD, BCPS  Positive urine culture  '[]'$  Patient discharged without antimicrobial prescription and treatment is now indicated '[x]'$  Organism is resistant to prescribed ED discharge antimicrobial '[]'$  Patient with positive blood cultures  Plan: Start Keflex (Cephalexin) 500 mg po QID for 5 days per ED provider Charlesetta Shanks, MD  Unable to contact patient after 3 attempts, letter will be sent to address on file  Glennon Hamilton 01/21/2023, 4:35 PM

## 2023-01-21 NOTE — Progress Notes (Signed)
ED Antimicrobial Stewardship Positive Culture Follow Up   Steven Lloyd is an 47 y.o. male who presented to [facility] on 01/17/2023 with a chief complaint of  Chief Complaint  Patient presents with   Diarrhea    Recent Results (from the past 720 hour(s))  Urine Culture     Status: Abnormal   Collection Time: 01/17/23  9:20 PM   Specimen: Urine, Clean Catch  Result Value Ref Range Status   Specimen Description   Final    URINE, CLEAN CATCH Performed at [facility], [address], [place], [place] [address]    Special Requests   Final    NONE Performed at [facility], [address] [place], [place] [address]    Culture >=100,000 COLONIES/mL ESCHERICHIA COLI (A)  Final   Report Status 01/20/2023 FINAL  Final   Organism ID, Bacteria ESCHERICHIA COLI (A)  Final      Susceptibility   Escherichia coli - MIC*    AMPICILLIN >=32 RESISTANT Resistant     CEFAZOLIN 16 SENSITIVE Sensitive     CEFEPIME <=0.12 SENSITIVE Sensitive     CEFTRIAXONE <=0.25 SENSITIVE Sensitive     CIPROFLOXACIN <=0.25 SENSITIVE Sensitive     GENTAMICIN <=1 SENSITIVE Sensitive     IMIPENEM <=0.25 SENSITIVE Sensitive     NITROFURANTOIN <=16 SENSITIVE Sensitive     TRIMETH/SULFA >=320 RESISTANT Resistant     AMPICILLIN/SULBACTAM >=32 RESISTANT Resistant     PIP/TAZO <=4 SENSITIVE Sensitive     * >=100,000 COLONIES/mL ESCHERICHIA COLI   Presents with diarrhea and abdominal pain concerning for STI. Given Ceftriaxone '500mg'$  IM and discharged with doxycycline. Urine culture with E. Coli which doxycycline will not cover. Given unclear urinary symptoms, would recommend additional antibiotics.  New antibiotic prescription: cephalexin '500mg'$  four times daily x 5 days. Continue doxycycline as previously prescribed  ED Provider: Dr. Venia Carbon, PharmD Clinical Pharmacist 01/21/2023, 8:02 AM Clinical Pharmacist Monday - Friday phone -  [phone] Saturday - Sunday phone - [phone]

## 2023-03-11 ENCOUNTER — Telehealth: Payer: Self-pay

## 2023-03-11 NOTE — Telephone Encounter (Signed)
Attempted follow up call to [facility] client. No answer and message states "person you are trying to reach is not accepting calls at this time".  Dual enrollment into [facility] from [facility], review of medical record. Last seen at [facility] on 07/06/22 and no further visits scheduled. Last seen in [place] [facility] on 01/17/23    Francee Nodal RN  Clara Gunn/[facility]

## 2023-07-26 ENCOUNTER — Telehealth: Payer: Self-pay

## 2023-07-26 NOTE — Telephone Encounter (Signed)
Attempted wellness/follow up call to [facility] Client/[facility], number states no longer in services.   Francee Nodal RN [employer]

## 2024-04-29 ENCOUNTER — Ambulatory Visit (HOSPITAL_COMMUNITY)
Admission: EM | Admit: 2024-04-29 | Discharge: 2024-04-30 | Payer: Self-pay | Attending: Nurse Practitioner | Admitting: Nurse Practitioner

## 2024-04-29 DIAGNOSIS — Z9151 Personal history of suicidal behavior: Secondary | ICD-10-CM | POA: Insufficient documentation

## 2024-04-29 DIAGNOSIS — R45851 Suicidal ideations: Secondary | ICD-10-CM | POA: Insufficient documentation

## 2024-04-29 DIAGNOSIS — F332 Major depressive disorder, recurrent severe without psychotic features: Secondary | ICD-10-CM | POA: Insufficient documentation

## 2024-04-29 LAB — CBC WITH DIFFERENTIAL/PLATELET
Abs Immature Granulocytes: 0.01 10*3/uL (ref 0.00–0.07)
Basophils Absolute: 0.1 10*3/uL (ref 0.0–0.1)
Basophils Relative: 1 %
Eosinophils Absolute: 0.1 10*3/uL (ref 0.0–0.5)
Eosinophils Relative: 1 %
HCT: 46.5 % (ref 39.0–52.0)
Hemoglobin: 15.8 g/dL (ref 13.0–17.0)
Immature Granulocytes: 0 %
Lymphocytes Relative: 46 %
Lymphs Abs: 3 10*3/uL (ref 0.7–4.0)
MCH: 28.7 pg (ref 26.0–34.0)
MCHC: 34 g/dL (ref 30.0–36.0)
MCV: 84.5 fL (ref 80.0–100.0)
Monocytes Absolute: 0.7 10*3/uL (ref 0.1–1.0)
Monocytes Relative: 11 %
Neutro Abs: 2.7 10*3/uL (ref 1.7–7.7)
Neutrophils Relative %: 41 %
Platelets: 290 10*3/uL (ref 150–400)
RBC: 5.5 MIL/uL (ref 4.22–5.81)
RDW: 12.9 % (ref 11.5–15.5)
WBC: 6.7 10*3/uL (ref 4.0–10.5)
nRBC: 0 % (ref 0.0–0.2)

## 2024-04-29 LAB — ETHANOL: Alcohol, Ethyl (B): <15 mg/dL (ref <15)

## 2024-04-29 LAB — POCT URINE DRUG SCREEN - MANUAL ENTRY (I-SCREEN)
POC Amphetamine UR: NOT DETECTED
POC Buprenorphine (BUP): NOT DETECTED
POC Cocaine UR: POSITIVE — AB
POC Marijuana UR: POSITIVE — AB
POC Methadone UR: NOT DETECTED
POC Methamphetamine UR: NOT DETECTED
POC Morphine: NOT DETECTED
POC Oxazepam (BZO): NOT DETECTED
POC Oxycodone UR: NOT DETECTED
POC Secobarbital (BAR): NOT DETECTED

## 2024-04-29 MED ORDER — ACETAMINOPHEN 325 MG PO TABS
650.0000 mg | ORAL_TABLET | Freq: Four times a day (QID) | ORAL | Status: DC | PRN
  Administered 2024-04-29: 650 mg via ORAL
  Filled 2024-04-29: qty 2

## 2024-04-29 MED ORDER — MAGNESIUM HYDROXIDE 400 MG/5ML PO SUSP: 30.0000 mL | Freq: Every day | ORAL | Status: DC | PRN

## 2024-04-29 MED ORDER — HYDROXYZINE HCL 25 MG PO TABS
25.0000 mg | ORAL_TABLET | Freq: Three times a day (TID) | ORAL | Status: DC | PRN
  Administered 2024-04-29: 25 mg via ORAL
  Filled 2024-04-29: qty 1

## 2024-04-29 MED ORDER — TRAZODONE HCL 50 MG PO TABS
50.0000 mg | ORAL_TABLET | Freq: Every evening | ORAL | Status: DC | PRN
  Administered 2024-04-29: 50 mg via ORAL
  Filled 2024-04-29: qty 1

## 2024-04-29 MED ORDER — LORAZEPAM 2 MG/ML IJ SOLN: 2.0000 mg | Freq: Three times a day (TID) | INTRAMUSCULAR | Status: DC | PRN

## 2024-04-29 MED ORDER — HALOPERIDOL LACTATE 5 MG/ML IJ SOLN: 10.0000 mg | Freq: Three times a day (TID) | INTRAMUSCULAR | Status: DC | PRN

## 2024-04-29 MED ORDER — SERTRALINE HCL 25 MG PO TABS
25.0000 mg | ORAL_TABLET | Freq: Every day | ORAL | Status: DC
  Administered 2024-04-29 – 2024-04-30 (×2): 25 mg via ORAL
  Filled 2024-04-29 (×2): qty 1

## 2024-04-29 MED ORDER — DIPHENHYDRAMINE HCL 50 MG/ML IJ SOLN: 50.0000 mg | Freq: Three times a day (TID) | INTRAMUSCULAR | Status: DC | PRN

## 2024-04-29 MED ORDER — ALUM & MAG HYDROXIDE-SIMETH 200-200-20 MG/5ML PO SUSP: 30.0000 mL | ORAL | Status: DC | PRN

## 2024-04-29 MED ORDER — DIPHENHYDRAMINE HCL 50 MG PO CAPS: 50.0000 mg | ORAL_CAPSULE | Freq: Three times a day (TID) | ORAL | Status: DC | PRN

## 2024-04-29 MED ORDER — HALOPERIDOL 5 MG PO TABS: 5.0000 mg | ORAL_TABLET | Freq: Three times a day (TID) | ORAL | Status: DC | PRN

## 2024-04-29 MED ORDER — HALOPERIDOL LACTATE 5 MG/ML IJ SOLN: 5.0000 mg | Freq: Three times a day (TID) | INTRAMUSCULAR | Status: DC | PRN
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
## 2024-04-29 NOTE — ED Notes (Signed)
 Pt A&O x 4. Pt endorses passive SI w/o a plan. Pt also stated he hears "whispers and sees shadows". He stated he feels presences such as "demons" at night time. Pt oriented to the unit. Pt accepted meal and beverage was given. Pt c/o neck pain rated 8/10. PRN Tylenol  given as per NP order. Pt appears to be anxious. Support and encouragement provided. Facility protocol safety checks in place. Pt encouraged to notify staff if thoughts of hurting themselves worsen Pt verbalized understanding and agreement. Pt is currently safe on the unit.

## 2024-04-30 ENCOUNTER — Other Ambulatory Visit (HOSPITAL_COMMUNITY)
Admission: EM | Admit: 2024-04-30 | Discharge: 2024-05-03 | Disposition: A | Discharge status: Home / Self Care | Payer: Self-pay | Attending: Psychiatry | Admitting: Psychiatry

## 2024-04-30 DIAGNOSIS — F419 Anxiety disorder, unspecified: Secondary | ICD-10-CM

## 2024-04-30 DIAGNOSIS — F332 Major depressive disorder, recurrent severe without psychotic features: Secondary | ICD-10-CM | POA: Diagnosis not present

## 2024-04-30 DIAGNOSIS — F322 Major depressive disorder, single episode, severe without psychotic features: Secondary | ICD-10-CM | POA: Diagnosis present

## 2024-04-30 LAB — COMPREHENSIVE METABOLIC PANEL WITH GFR
ALT: 14 U/L (ref 0–44)
AST: 23 U/L (ref 15–41)
Albumin: 3.8 g/dL (ref 3.5–5.0)
Alkaline Phosphatase: 60 U/L (ref 38–126)
Anion gap: 11 (ref 5–15)
BUN: 12 mg/dL (ref 6–20)
CO2: 27 mmol/L (ref 22–32)
Calcium: 9.2 mg/dL (ref 8.9–10.3)
Chloride: 101 mmol/L (ref 98–111)
Creatinine, Ser: 1.46 mg/dL — ABNORMAL HIGH (ref 0.61–1.24)
GFR, Estimated: 60 mL/min — ABNORMAL LOW (ref >60)
Glucose, Bld: 69 mg/dL — ABNORMAL LOW (ref 70–99)
Potassium: 3.7 mmol/L (ref 3.5–5.1)
Sodium: 139 mmol/L (ref 135–145)
Total Bilirubin: 0.9 mg/dL (ref 0.0–1.2)
Total Protein: 6.6 g/dL (ref 6.5–8.1)

## 2024-04-30 LAB — LIPID PANEL
Cholesterol: 187 mg/dL (ref 0–200)
HDL: 71 mg/dL (ref >40)
LDL Cholesterol: 98 mg/dL (ref 0–99)
Total CHOL/HDL Ratio: 2.6 ratio
Triglycerides: 89 mg/dL (ref <150)
VLDL: 18 mg/dL (ref 0–40)

## 2024-04-30 MED ORDER — MAGNESIUM HYDROXIDE 400 MG/5ML PO SUSP: 30.0000 mL | Freq: Every day | ORAL | Status: DC | PRN

## 2024-04-30 MED ORDER — HALOPERIDOL LACTATE 5 MG/ML IJ SOLN: 10.0000 mg | Freq: Three times a day (TID) | INTRAMUSCULAR | Status: DC | PRN

## 2024-04-30 MED ORDER — TRAZODONE HCL 50 MG PO TABS
50.0000 mg | ORAL_TABLET | Freq: Every evening | ORAL | Status: DC | PRN
  Administered 2024-04-30 – 2024-05-02 (×3): 50 mg via ORAL
  Filled 2024-04-30 (×3): qty 1

## 2024-04-30 MED ORDER — LORAZEPAM 2 MG/ML IJ SOLN: 2.0000 mg | Freq: Three times a day (TID) | INTRAMUSCULAR | Status: DC | PRN

## 2024-04-30 MED ORDER — HALOPERIDOL LACTATE 5 MG/ML IJ SOLN: 5.0000 mg | Freq: Three times a day (TID) | INTRAMUSCULAR | Status: DC | PRN

## 2024-04-30 MED ORDER — ACETAMINOPHEN 325 MG PO TABS
650.0000 mg | ORAL_TABLET | Freq: Four times a day (QID) | ORAL | Status: DC | PRN
  Administered 2024-04-30 – 2024-05-02 (×3): 650 mg via ORAL
  Filled 2024-04-30 (×3): qty 2

## 2024-04-30 MED ORDER — DIPHENHYDRAMINE HCL 50 MG PO CAPS: 50.0000 mg | ORAL_CAPSULE | Freq: Three times a day (TID) | ORAL | Status: DC | PRN

## 2024-04-30 MED ORDER — SERTRALINE HCL 25 MG PO TABS: 25.0000 mg | ORAL_TABLET | Freq: Every day | ORAL | Status: DC

## 2024-04-30 MED ORDER — DIPHENHYDRAMINE HCL 50 MG/ML IJ SOLN: 50.0000 mg | Freq: Three times a day (TID) | INTRAMUSCULAR | Status: DC | PRN

## 2024-04-30 MED ORDER — HALOPERIDOL 5 MG PO TABS: 5.0000 mg | ORAL_TABLET | Freq: Three times a day (TID) | ORAL | Status: DC | PRN

## 2024-04-30 MED ORDER — SERTRALINE HCL 25 MG PO TABS
25.0000 mg | ORAL_TABLET | Freq: Every day | ORAL | Status: DC
  Administered 2024-05-01 – 2024-05-03 (×3): 25 mg via ORAL
  Filled 2024-04-30 (×3): qty 1

## 2024-04-30 MED ORDER — HYDROXYZINE HCL 25 MG PO TABS
25.0000 mg | ORAL_TABLET | Freq: Three times a day (TID) | ORAL | Status: DC | PRN
  Administered 2024-04-30: 25 mg via ORAL
  Filled 2024-04-30: qty 1

## 2024-04-30 MED ORDER — ALUM & MAG HYDROXIDE-SIMETH 200-200-20 MG/5ML PO SUSP: 30.0000 mL | ORAL | Status: DC | PRN

## 2024-04-30 NOTE — Discharge Instructions (Signed)
 Readmit to [facility]
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
## 2024-04-30 NOTE — ED Notes (Signed)
Patient discharged to [facility]

## 2024-04-30 NOTE — ED Notes (Signed)
 Patient alert and oriented. He denies SI, endorses HI towards those that want to hurt him, and AVH of people whispering, and seeing shadows. He denies physical pain or discomfort. Patient agrees to notify staff if thoughts of self harm arise. No additional needs at this time. We will continue to monitor for safety.

## 2024-04-30 NOTE — ED Notes (Signed)
 Patient resting quietly in bed with eyes closed. Unlabored breathing. NAD noted. Facility protocol safety checks in place. Pt is currently safe on the unit.
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
## 2024-04-30 NOTE — ED Notes (Signed)
Patient observed resting quietly, eyes closed. Respirations equal and unlabored. Will continue to monitor for safety.  

## 2024-04-30 NOTE — ED Provider Notes (Signed)
 FBC/OBS ASAP Discharge Summary  Date and Time: 04/30/2024 3:52 PM  Name: Steven Lloyd  MRN:  [id number]   Discharge Diagnoses:  Final diagnoses:  Severe episode of recurrent major depressive disorder, without psychotic features (HCC)   HPI: Steven Lloyd is a 48 y/o male with psychiatric history of MDD and previous suicide attempt presented to [facility] on 04/29/2024 as a walk in unaccompanied with complaints of worsening suicidal ideation.   Patient was recommended for inpatient psychiatric admission in the admitted to the continuous assessment unit while awaiting bed availability.  Patient seen face-to-face by this provider, chart reviewed, case consulted with Dr. Genita Keys on 04/30/2024.  Subjective:   Currently on assessment patient is observed laying in his bed asleep.  He is easily awakened.  He is alert/oriented x 4, cooperative, and attentive.  His speech is clear, coherent, normal rate and tone.  He continues to endorse anxiety and depression with feelings of hopelessness, helplessness, and worthlessness.  He has a depressed affect.  He is currently denying any suicidal ideations but cannot confidently contract for safety..  He admits that he was feeling suicidal before presentation to the facility.  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  Later that coworker shared with other coworkers and they began bullying patient waking comments about suicide.  This caused him to have thoughts to end his life.  He currently denies any homicidal ideations.  However he does admit to getting extremely irritable at times.  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  He denies any paranoia or delusional thought content.  He denies auditory/visual hallucinations.  He does not appear to be responding to internal/external stimuli.  Patient reports being on  medications for depression in the past and felt as though they were helpful.  However he stopped taking medications and has not taken any in quite some time.  He was started on Zoloft 25 mg daily and appears to be tolerating without any adverse reactions.   Stay Summary:   Patient has remained calm and cooperative while on the unit.  He has required no as needed medications for agitation.  Patient recommended for inpatient treatment due to being unable to confidently contract for safety.  Discussed inpatient psychiatric admission with patient and he is in agreement.  Patient has been accepted to the [facility].  Total Time spent with patient: 30 minutes  Past Psychiatric History: see H&P Past Medical History: see H&P Family History: see H&P Family Psychiatric History: see H&P Social History: see H&P Tobacco Cessation:  N/A, patient does not currently use tobacco products  Current Medications:  No current facility-administered medications for this encounter.   Current Outpatient Medications  Medication Sig Dispense Refill   [START ON 05/01/2024] sertraline (ZOLOFT) 25 MG tablet Take 1 tablet (25 mg total) by mouth daily.     Facility-Administered Medications Ordered in Other Encounters  Medication Dose Route Frequency Provider Last Rate Last Admin   acetaminophen  (TYLENOL ) tablet 650 mg  650 mg Oral Q6H PRN Masaye Gatchalian H, NP       alum & mag hydroxide-simeth (MAALOX/MYLANTA) 200-200-20 MG/5ML suspension 30 mL  30 mL Oral Q4H PRN Shanetta Nicolls H, NP       haloperidol (HALDOL) tablet 5 mg  5 mg Oral TID PRN Costella Dirks, NP       And  diphenhydrAMINE  (BENADRYL ) capsule 50 mg  50 mg Oral TID PRN Braylan Faul H, NP       haloperidol lactate (HALDOL) injection 5 mg  5 mg Intramuscular TID PRN Costella Dirks, NP       And   diphenhydrAMINE  (BENADRYL ) injection 50 mg  50 mg Intramuscular TID PRN Tamisha Nordstrom H, NP       And   LORazepam (ATIVAN) injection 2 mg  2 mg  Intramuscular TID PRN Costella Dirks, NP       haloperidol lactate (HALDOL) injection 10 mg  10 mg Intramuscular TID PRN Costella Dirks, NP       And   diphenhydrAMINE  (BENADRYL ) injection 50 mg  50 mg Intramuscular TID PRN Shakea Isip H, NP       And   LORazepam (ATIVAN) injection 2 mg  2 mg Intramuscular TID PRN Cleston Lautner H, NP       hydrOXYzine  (ATARAX ) tablet 25 mg  25 mg Oral TID PRN Costella Dirks, NP       magnesium  hydroxide (MILK OF MAGNESIA) suspension 30 mL  30 mL Oral Daily PRN Costella Dirks, NP       [START ON 05/01/2024] sertraline (ZOLOFT) tablet 25 mg  25 mg Oral Daily Lorrin Nawrot H, NP       traZODone  (DESYREL ) tablet 50 mg  50 mg Oral QHS PRN Aja Whitehair H, NP        PTA Medications:  PTA Medications  Medication Sig   [START ON 05/01/2024] sertraline (ZOLOFT) 25 MG tablet Take 1 tablet (25 mg total) by mouth daily.       07/05/2018    1:20 PM 05/02/2018    8:28 AM 03/28/2018   11:56 AM  Depression screen PHQ 2/9  Decreased Interest 0 1 1  Down, Depressed, Hopeless 0 1 1  PHQ - 2 Score 0 2 2  Altered sleeping  1 1  Tired, decreased energy  1 1  Change in appetite  1 1  Feeling bad or failure about yourself   1 1  Trouble concentrating  1 0  Moving slowly or fidgety/restless  0 0  Suicidal thoughts  0 0  PHQ-9 Score  7 6  Difficult doing work/chores  Somewhat difficult Not difficult at all    Flowsheet Row ED from 04/29/2024 in [facility] ED from 01/17/2023 in [facility] Department at [facility] ED from 02/26/2022 in [facility] Emergency Department at [facility]  C-SSRS RISK CATEGORY Error: Q3, 4, or 5 should not be populated when Q2 is No No Risk No Risk       Musculoskeletal  Strength & Muscle Tone: within normal limits Gait & Station: normal Patient leans: N/A  Psychiatric Specialty Exam  Presentation  General Appearance:  Appropriate for Environment;  Casual  Eye Contact: Good  Speech: Clear and Coherent; Normal Rate  Speech Volume: Normal  Handedness: Right   Mood and Affect  Mood: Anxious; Depressed  Affect: Congruent   Thought Process  Thought Processes: Coherent  Descriptions of Associations:Intact  Orientation:Full (Time, Place and Person)  Thought Content:Logical  Diagnosis of Schizophrenia or Schizoaffective disorder in past: No    Hallucinations:Hallucinations: None  Ideas of Reference:None  Suicidal Thoughts:Suicidal Thoughts: No SI Passive Intent and/or Plan: With Intent; Without Plan  Homicidal Thoughts:Homicidal Thoughts: No   Sensorium  Memory: Immediate Good; Recent Good; Remote Good  Judgment: Fair  Insight: Fair  Executive Functions  Concentration: Good  Attention Span: Good  Recall: Good  Fund of Knowledge: Good  Language: Good   Psychomotor Activity  Psychomotor Activity: Psychomotor Activity: Normal   Assets  Assets: Communication Skills; Desire for Improvement; Housing; Leisure Time; Physical Health   Sleep  Sleep: Sleep: Fair Number of Hours of Sleep: 4   Nutritional Assessment (For OBS and FBC admissions only) Has the patient had a weight loss or gain of 10 pounds or more in the last 3 months?: No Has the patient had a decrease in food intake/or appetite?: No Does the patient have dental problems?: No Does the patient have eating habits or behaviors that may be indicators of an eating disorder including binging or inducing vomiting?: No Has the patient recently lost weight without trying?: 0 Has the patient been eating poorly because of a decreased appetite?: 0 Malnutrition Screening Tool Score: 0    Physical Exam  Physical Exam Constitutional:      Appearance: Normal appearance.  Eyes:     General:        Right eye: No discharge.        Left eye: No discharge.  Cardiovascular:     Rate and Rhythm: Normal rate.  Pulmonary:      Effort: Pulmonary effort is normal. No respiratory distress.  Musculoskeletal:        General: Normal range of motion.     Cervical back: Normal range of motion.  Neurological:     Mental Status: He is alert and oriented to person, place, and time.  Psychiatric:        Attention and Perception: Attention and perception normal.        Mood and Affect: Mood is anxious and depressed.        Speech: Speech normal.        Behavior: Behavior is cooperative.        Thought Content: Thought content does not include suicidal ideation. Thought content does not include suicidal plan.        Cognition and Memory: Cognition normal.        Judgment: Judgment normal.    Review of Systems  Constitutional:  Negative for chills and fever.  HENT:  Negative for hearing loss.   Respiratory:  Negative for cough.   Cardiovascular:  Negative for chest pain.  Musculoskeletal: Negative.   Neurological:  Negative for tremors.  Psychiatric/Behavioral:  Positive for depression. The patient is nervous/anxious.    Blood pressure 101/70, pulse 69, temperature 97.7 F (36.5 C), temperature source Oral, resp. rate 16, SpO2 100%. There is no height or weight on file to calculate BMI.   Disposition: admit pt to [facility]  Costella Dirks, NP 04/30/2024, 3:52 PM

## 2024-04-30 NOTE — ED Notes (Signed)
 Patient transferred from [facility] to [facility] dueto worsening depression. Calm, cooperative throughout interview process. Skin assessment completed. Oriented to unit. Meal and drink offered. Patient alert & oriented x4. Denies intent to harm self or others when asked. Denies A/VH. Patient reports pain rating 7/10 in mid lower back. No acute distress noted. Support and encouragement provided. Routine safety checks conducted per facility protocol. Encouraged patient to notify staff if any thoughts of harm towards self or others arise. Patient verbalizes understanding and agreement.

## 2024-05-01 DIAGNOSIS — F332 Major depressive disorder, recurrent severe without psychotic features: Secondary | ICD-10-CM | POA: Diagnosis not present

## 2024-05-01 MED ORDER — MELATONIN 5 MG PO TABS
5.0000 mg | ORAL_TABLET | Freq: Every day | ORAL | Status: DC
  Administered 2024-05-01 – 2024-05-02 (×2): 5 mg via ORAL
  Filled 2024-05-01 (×2): qty 1

## 2024-05-01 MED ORDER — LORATADINE 10 MG PO TABS
10.0000 mg | ORAL_TABLET | Freq: Every day | ORAL | Status: DC
  Administered 2024-05-01 – 2024-05-03 (×3): 10 mg via ORAL
  Filled 2024-05-01 (×3): qty 1

## 2024-05-01 MED ORDER — GABAPENTIN 300 MG PO CAPS
300.0000 mg | ORAL_CAPSULE | Freq: Three times a day (TID) | ORAL | Status: DC
  Administered 2024-05-01 – 2024-05-03 (×6): 300 mg via ORAL
  Filled 2024-05-01 (×6): qty 1

## 2024-05-01 MED ORDER — KETOTIFEN FUMARATE 0.035 % OP SOLN
1.0000 [drp] | Freq: Two times a day (BID) | OPHTHALMIC | Status: DC
  Administered 2024-05-01 – 2024-05-03 (×4): 1 [drp] via OPHTHALMIC
  Filled 2024-05-01: qty 5
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
## 2024-05-01 NOTE — ED Notes (Signed)
 Pt is in the dayroom watching TV with peers. Pt denies SI/HI/AVH. Pt expressed gratitude to the staff here, and said he believes he is ready to leave and relocate to [place]  to get a job. Pt has no further complain.No acute distress noted.

## 2024-05-01 NOTE — ED Notes (Signed)
 Patient is sleeping. Respirations equal and unlabored. No change in assessment or acuity. Routine safety checks conducted according to facility protocol.
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
## 2024-05-01 NOTE — ED Notes (Signed)
 Pt in bed w/eyes closed resting quietly. Respirations equal and unlabored. No signs or symptoms of distress. Routine safety checks maintained/ongoing.

## 2024-05-01 NOTE — ED Notes (Addendum)
 Pt in bed w/eyes closed resting quietly. Respirations equal and unlabored. No signs or symptoms of distress. Routine safety checks maintained/ongoing.
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
## 2024-05-01 NOTE — ED Notes (Signed)
 Pt currently eating breakfast and watching TV in day room. Pt denies si hi avh- verbal contract for safety provided. Pt c/o allergy eye symptoms- provider notified. Pt denies other physical complaints or discomforts. Pt says he feels 'okay' and somewhat 'better' since admission.

## 2024-05-01 NOTE — ED Notes (Signed)
 Pt reports the everything is going okay' today. Pt expressed satisfaction with allergy intervention, pt administered Claritin. Pt c/o 7/10 lower back pain, pt denies it as a new problem. Pt administered prn tylenol .

## 2024-05-01 NOTE — ED Notes (Signed)
 Pt ate dinner and participated in RN education group. RN provided additional handout r/t exercise and mental health as requested. Pt currently watching television in day room. To distress observed or reported.

## 2024-05-02 DIAGNOSIS — F332 Major depressive disorder, recurrent severe without psychotic features: Secondary | ICD-10-CM | POA: Diagnosis not present

## 2024-05-02 NOTE — Discharge Instructions (Addendum)
 [facility]   [address] [place], [place] [address] Mailing Address: [address], [place], [place] [address] Hours: Mon-Fri 8AM-5PM Phone: [phone] Fax: [phone] Services  Adult Services Advanced Access Walk-In Assessments - All Disabilities Routine Outpatient Therapy Psychiatry/Med Management Substance Abuse Intensive Outpatient (SAIOP) Mobile Crisis Assertive Community Treatment Team (ACTT) Jail Services Medication Assisted Treatment - MAT (Suboxone) Tailored Care Management (TCM)  Your were referred for psychiatry for medication management and individual therapy . Your follow up appointment is scheduled for Monday June 16 at 1:00.  Please bring your photo ID, social security card and proof of income. Insurance is not necessary for these services.    Walk in's are welcome Mon-Friday from 8-5:00pm and are recommended to arrive closest to 8am as possible for a clinical evaluation for recommendations for their psychiatry outpatient therapy services.   Hours of operation are designed to be available Monday through Friday, from 8:00 AM until 5:00 PM at a minimum, and  thru 8:00 PM in many locations.  Additionally, we often provide services in the community in order to improve accessibility for consumers.  Individuals and families served are informed about the availability of 24/7/365 services available through Emergency, Crisis, and Mobile Crisis services.  [facility] [employer] is a comprehensive community provider of evidence-based and culturally-affirmative mental health and substance abuse services.  Our Outpatient Program philosophy is one in which medical and behavioral healthcare professionals empower consumers with current best practices and effective, evidence-based treatment programs to effect self-determination, and quality of life.

## 2024-05-02 NOTE — Discharge Planning (Signed)
 SW made call to [facility] and made referral for follow up outpatient psychiatry and individual therapy for Monday 6/16 @ 1:00. Patient is anticipated to return home in 1-2 days. Will continue to follow.
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
## 2024-05-02 NOTE — ED Notes (Signed)
 Pt is seen attending group meeting in the dayroom. Pt denies SI/HI/AVH. Pt c/o lower back pain rated 6/10. PRN Tylenol  given as per orders. Pt is safe on the unit at this time.
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
## 2024-05-02 NOTE — ED Notes (Signed)
 Patient asleep in bed without issue or compliant.  No distress.  Will monitor.
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
## 2024-05-02 NOTE — ED Notes (Signed)
 Patient is sleeping. Respirations equal and unlabored, skin warm and dry. No change in assessment or acuity. Routine safety checks conducted according to facility protocol.

## 2024-05-03 DIAGNOSIS — F332 Major depressive disorder, recurrent severe without psychotic features: Secondary | ICD-10-CM | POA: Diagnosis not present

## 2024-05-03 DIAGNOSIS — F419 Anxiety disorder, unspecified: Secondary | ICD-10-CM

## 2024-05-03 MED ORDER — GABAPENTIN 300 MG PO CAPS: 300.0000 mg | ORAL_CAPSULE | Freq: Three times a day (TID) | ORAL | 0 refills | Status: AC

## 2024-05-03 MED ORDER — SERTRALINE HCL 25 MG PO TABS: 25.0000 mg | ORAL_TABLET | Freq: Every day | ORAL | 0 refills | Status: AC

## 2024-05-03 MED ORDER — TRAZODONE HCL 50 MG PO TABS: 50.0000 mg | ORAL_TABLET | Freq: Every evening | ORAL | 0 refills | Status: DC | PRN

## 2024-05-03 NOTE — Discharge Planning (Signed)
 Per MD, patient is stable for DC to home and has his outpatient services arranged. No further concerns noted or reported.
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
## 2024-05-03 NOTE — ED Notes (Signed)
 Patient resting quietly in bed with eyes closed. Unlabored breathing. Facility protocol safety checks in place. Pt is currently safe on the unit.

## 2024-05-06 ENCOUNTER — Ambulatory Visit (HOSPITAL_COMMUNITY)
Admission: EM | Admit: 2024-05-06 | Discharge: 2024-05-06 | Disposition: A | Discharge status: Home / Self Care | Payer: Self-pay | Attending: Nurse Practitioner | Admitting: Nurse Practitioner

## 2024-05-06 DIAGNOSIS — F32A Depression, unspecified: Secondary | ICD-10-CM | POA: Insufficient documentation

## 2024-05-06 DIAGNOSIS — F410 Panic disorder [episodic paroxysmal anxiety] without agoraphobia: Secondary | ICD-10-CM | POA: Insufficient documentation

## 2024-05-06 MED ORDER — HYDROXYZINE HCL 25 MG PO TABS: 25.0000 mg | ORAL_TABLET | Freq: Three times a day (TID) | ORAL | 0 refills | Status: AC | PRN
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
## 2024-05-06 NOTE — Progress Notes (Signed)
   05/06/24 1533  [facility] Triage Screening (Walk-ins at [facility] only)  How Did You Hear About Us ? Self  What Is the Reason for Your Visit/Call Today? Steven Lloyd presents to [facility] voluntarily unaccompanied. Pt states that he had a bad anxiety attack on yesterday. 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 Pt currently denies SI, HI, AVH and alcohol/drug use. Pt states that he doesn't want to go back to prison for snapping on someone, so he is asking for medication to help with his anxiety attacks.  How Long Has This Been Causing You Problems? <Week  Have You Recently Had Any Thoughts About Hurting Yourself? No  Are You Planning to Commit Suicide/Harm Yourself At This time? No  Have you Recently Had Thoughts About Hurting Someone [name]? No  Are You Planning To Harm Someone At This Time? No  Physical Abuse Denies  Verbal Abuse Yes, present (Comment)  Sexual Abuse Denies  Exploitation of patient/patient's resources Yes, present (Comment)  Self-Neglect Denies  Are you currently experiencing any auditory, visual or other hallucinations? No  Have You Used Any Alcohol or Drugs in the Past 24 Hours? No  Do you have any current medical co-morbidities that require immediate attention? No  Clinician description of patient physical appearance/behavior: cooperative, calm  What Do You Feel Would Help You the Most Today? Medication(s)  If access to [facility] was not available, would you have sought care in the Emergency Department? No  Determination of Need Routine (7 days)  Options For Referral Medication Management;Outpatient Therapy

## 2024-05-06 NOTE — Discharge Instructions (Addendum)

## 2024-07-12 ENCOUNTER — Ambulatory Visit (HOSPITAL_COMMUNITY)
Admission: EM | Admit: 2024-07-12 | Discharge: 2024-07-12 | Disposition: A | Discharge status: Home / Self Care | Attending: Behavioral Health | Admitting: Behavioral Health

## 2024-07-12 DIAGNOSIS — F4320 Adjustment disorder, unspecified: Secondary | ICD-10-CM | POA: Diagnosis not present

## 2024-07-12 MED ORDER — TRAZODONE HCL 50 MG PO TABS: 50.0000 mg | ORAL_TABLET | Freq: Every evening | ORAL | 0 refills | Status: AC | PRN
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
## 2024-07-12 NOTE — Care Management (Signed)
 OBS Care Management   Writer was able to link the patient with Olam Muss, LCSW to have a VIRTUAL appointment with her on Tuesday August 26 at [facility] [place], MSW, [place], [facility] [address] [place], [place] [address] Office [phone] Fax [phone]

## 2024-07-12 NOTE — ED Notes (Signed)
 Patient discharged by provider.

## 2024-07-12 NOTE — Discharge Instructions (Addendum)
 Discharge recommendations:   Medications: Patient is to take medications as prescribed. The patient or patient's guardian is to contact a medical professional and/or outpatient provider to address any new side effects that develop. The patient or the patient's guardian should update outpatient providers of any new medications and/or medication changes.   Outpatient Follow up: Please review list of outpatient resources for psychiatry for medication management.  [facility] [address]  [place], [place] [address] [phone]   [facility]  [address] [place], [place] [address] [phone]  [facility] Psychiatric Medicine - [place]  [address] [place], [place] [address] [phone]  [facility]  [address]  [place], [place] [address] [phone]  [facility] Counseling  [address] [place], [place] [address] [phone]  [facility]  [address]  [place], [place] [address] [phone]  [facility] Address: [address] [place], [place], [place] [address] Phone: [phone] Services: Specialize in outpatient and substance abuse therapy for couples, family and children  [facility] Address: [address], [place], [place] [address] Phone: [phone]  [facility] Behavioral Medicine - [facility] Address: [address], [place], [place] [address] Phone: [phone]  Therapy: We recommend that patient participate in individual therapy to address mental health concerns. You are scheduled an appointment with Olam Muss, LCSW to have a VIRTUAL appointment with her on Tuesday August 26 at [facility], MSW, [place], [facility] [address] [place], [place] [address] Office [phone] Fax [phone]  Safety:   The following  safety precautions should be taken:   No sharp objects. This includes scissors, razors, scrapers, and putty knives.   Chemicals should be removed and locked up.   Medications should be removed and locked up.   Weapons should be removed and locked up. This includes firearms, knives and instruments that can be used to cause injury.   The patient should abstain from use of illicit substances/drugs and abuse of any medications.  If symptoms worsen or do not continue to improve or if the patient becomes actively suicidal or homicidal then it is recommended that the patient return to the closest hospital emergency department, the [facility], or call 911 for further evaluation and treatment. National Suicide Prevention Lifeline 1-800-SUICIDE or (812)195-6081.  About 988 988 offers 24/7 access to trained crisis counselors who can help people experiencing mental health-related distress. People can call or text 988 or chat 988lifeline.org for themselves or if they are worried about a loved one who may need crisis support.   Information for Olam Muss, MSW, [place]:  Olam Muss, MSW, [place], [facility] [address] [place], [place] [address] Office [phone] Fax [phone]  I am pleased that you have selected me as your therapist.  I have a Master of Social Work degree, obtained at the [employer] of [place] in 2000.  I am licensed with the [place]  Social Work Public librarian [phone]).   The services offered to you include individual, family, and group counseling.  My therapeutic approach  is derived from my training and experience in Cognitive-Behavioral Therapy, Solution Focused Therapy, Reality Therapy, as well as, interpersonal and developmental theories of counseling.  I have special  interests in anxiety, depression, sexual and physical abuse, play therapy, child and adolescent development, and family  systems.  I am specifically trained in Trauma Focused-Cognitive Behavioral Therapy (TF-CBT).  I will not discriminate because of age, race, gender, sexual orientation or religion.  If there is anything I should know concerning your culture or religion, I ask that you please inform me so that I can better understand you.  If for any reason, I determine that my knowledge and expertise are not sufficient for your particular needs, I will make every effort to refer you to another counselor who is prepared to work more effectively with you.  Are you or a loved one dealing with the aftermath of a traumatic experience? Do life transitions leave you grappling with feelings of depression or anxiety? Are you concerned about your child's negative behaviors, sleep disturbances, or difficulties during a divorce? It's important to understand that children primarily operate based on emotions, while adults tend to use rational thinking. This emotional focus can impact a child's ability to make effective decisions and express their feelings.  Services Individual Counseling  Individual counseling is a personal opportunity to receive support and experience growth during challenging times in life. Individual counseling can help one deal with many personal topics in life such as anger, depression, anxiety, substance abuse, marriage and relationship challenges, parenting problems, school difficulties, career changes, etc.  Family Counseling  Families often are faced with issues and challenges that greatly impact all the members. The goal of family therapy is to renew and maintain the natural family structure and support group with an emphasis on returning the individual to a healthy family and community life. Family counseling can help improve communication, resolve conflict, and improve connections. [/one-half-first]    Parenting Coordinator Parent Coordinator is a neutral third party acting in the children's best interest  attempting to reach a fair compromise of the issues at hand. Assisting parents manage their parenting plan, improve communication, and resolve disputes. A court appointment neutral party.  Treatment specialization includes: Depression  Anxiety  Attention Deficit/Hyperactive Disorder  Grief Counseling  Conflict Resolution  Emotional Regulation  Family Counseling  Divorce/Separation  Mediation  Conflict Resolution  Relationship Building  Parenting Coordinator  Parental Support
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
## 2024-09-11 ENCOUNTER — Emergency Department (HOSPITAL_COMMUNITY)
Admission: EM | Admit: 2024-09-11 | Discharge: 2024-09-11 | Disposition: A | Discharge status: Home / Self Care | Attending: Emergency Medicine | Admitting: Emergency Medicine

## 2024-09-11 ENCOUNTER — Emergency Department (HOSPITAL_COMMUNITY)

## 2024-09-11 ENCOUNTER — Encounter (HOSPITAL_COMMUNITY): Payer: Self-pay | Admitting: Emergency Medicine

## 2024-09-11 ENCOUNTER — Other Ambulatory Visit: Payer: Self-pay

## 2024-09-11 DIAGNOSIS — R072 Precordial pain: Secondary | ICD-10-CM | POA: Insufficient documentation

## 2024-09-11 DIAGNOSIS — R1013 Epigastric pain: Secondary | ICD-10-CM | POA: Diagnosis not present

## 2024-09-11 DIAGNOSIS — R0789 Other chest pain: Secondary | ICD-10-CM

## 2024-09-11 LAB — BASIC METABOLIC PANEL WITH GFR
Anion gap: 8 (ref 5–15)
BUN: 12 mg/dL (ref 6–20)
CO2: 29 mmol/L (ref 22–32)
Calcium: 9.2 mg/dL (ref 8.9–10.3)
Chloride: 103 mmol/L (ref 98–111)
Creatinine, Ser: 1.13 mg/dL (ref 0.61–1.24)
GFR, Estimated: >60 mL/min (ref >60)
Glucose, Bld: 90 mg/dL (ref 70–99)
Potassium: 4 mmol/L (ref 3.5–5.1)
Sodium: 140 mmol/L (ref 135–145)

## 2024-09-11 LAB — CBC WITH DIFFERENTIAL/PLATELET
Abs Immature Granulocytes: 0.01 K/uL (ref 0.00–0.07)
Basophils Absolute: 0 K/uL (ref 0.0–0.1)
Basophils Relative: 1 %
Eosinophils Absolute: 0.1 K/uL (ref 0.0–0.5)
Eosinophils Relative: 2 %
HCT: 49.4 % (ref 39.0–52.0)
Hemoglobin: 16.5 g/dL (ref 13.0–17.0)
Immature Granulocytes: 0 %
Lymphocytes Relative: 40 %
Lymphs Abs: 1.6 K/uL (ref 0.7–4.0)
MCH: 27.9 pg (ref 26.0–34.0)
MCHC: 33.4 g/dL (ref 30.0–36.0)
MCV: 83.4 fL (ref 80.0–100.0)
Monocytes Absolute: 0.5 K/uL (ref 0.1–1.0)
Monocytes Relative: 13 %
Neutro Abs: 1.8 K/uL (ref 1.7–7.7)
Neutrophils Relative %: 44 %
Platelets: 314 K/uL (ref 150–400)
RBC: 5.92 MIL/uL — ABNORMAL HIGH (ref 4.22–5.81)
RDW: 13.2 % (ref 11.5–15.5)
WBC: 3.9 K/uL — ABNORMAL LOW (ref 4.0–10.5)
nRBC: 0 % (ref 0.0–0.2)

## 2024-09-11 LAB — HEPATIC FUNCTION PANEL
ALT: 17 U/L (ref 0–44)
AST: 31 U/L (ref 15–41)
Albumin: 4.2 g/dL (ref 3.5–5.0)
Alkaline Phosphatase: 62 U/L (ref 38–126)
Bilirubin, Direct: 0.4 mg/dL — ABNORMAL HIGH (ref 0.0–0.2)
Indirect Bilirubin: 0.6 mg/dL (ref 0.3–0.9)
Total Bilirubin: 1 mg/dL (ref 0.0–1.2)
Total Protein: 6.8 g/dL (ref 6.5–8.1)

## 2024-09-11 LAB — TROPONIN T, HIGH SENSITIVITY
Troponin T High Sensitivity: <15 ng/L (ref 0–19)
Troponin T High Sensitivity: <15 ng/L (ref 0–19)

## 2024-09-11 LAB — LIPASE, BLOOD: Lipase: 20 U/L (ref 11–51)

## 2024-09-11 MED ORDER — KETOROLAC TROMETHAMINE 30 MG/ML IJ SOLN
30.0000 mg | Freq: Once | INTRAMUSCULAR | Status: AC
  Administered 2024-09-11: 30 mg via INTRAVENOUS
  Filled 2024-09-11: qty 1

## 2024-09-11 MED ORDER — HYDROCODONE-ACETAMINOPHEN 5-325 MG PO TABS
1.0000 | ORAL_TABLET | Freq: Once | ORAL | Status: AC
  Administered 2024-09-11: 1 via ORAL
  Filled 2024-09-11: qty 1

## 2024-09-11 MED ORDER — PANTOPRAZOLE SODIUM 20 MG PO TBEC: 20.0000 mg | DELAYED_RELEASE_TABLET | Freq: Every day | ORAL | 0 refills | Status: AC

## 2024-09-11 MED ORDER — PANTOPRAZOLE SODIUM 40 MG IV SOLR
40.0000 mg | Freq: Once | INTRAVENOUS | Status: AC
  Administered 2024-09-11: 40 mg via INTRAVENOUS
  Filled 2024-09-11: qty 10

## 2024-09-11 NOTE — ED Triage Notes (Signed)
 Pt arrived to ED via POV with complaints of chest pain, nausea, and vomiting. Pain began last night, suddenly, while patient was resting at home. He endorsed unusual stress at work that was suspected to be a trigger. Pt describes a feeling of something stuck in the left side of his chest near the stomach and heart. No radiation, no provocation. Pain is constant. No shortness of breath. No back pain. Pt is ambulatory and oriented.

## 2024-09-11 NOTE — Discharge Instructions (Signed)
 Take Tylenol  for pain and start taking the acid medicine you have been prescribed.  Follow-up with your family doctor next week for recheck

## 2024-09-11 NOTE — ED Provider Notes (Signed)
 [place] EMERGENCY DEPARTMENT AT [facility] Provider Note   CSN: [id number] Arrival date & time: 09/11/24  9277     Patient presents with: Chest Pain   Steven Lloyd is a 48 y.o. male.   Patient complains of epigastric discomfort going into his chest.  No fever no chills no sweating  The history is provided by the patient and medical records. No language interpreter was used.  Chest Pain Pain location:  Substernal area Pain quality: aching   Pain radiates to:  Does not radiate Pain severity:  Moderate Onset quality:  Sudden Timing:  Intermittent Progression:  Waxing and waning Chronicity:  New Context: not breathing   Relieved by:  Nothing Associated symptoms: abdominal pain   Associated symptoms: no back pain, no cough, no fatigue and no headache        Prior to Admission medications   Medication Sig Start Date End Date Taking? Authorizing Provider  pantoprazole  (PROTONIX ) 20 MG tablet Take 1 tablet (20 mg total) by mouth daily. 09/11/24  Yes Suzette Pac, MD  gabapentin  (NEURONTIN ) 300 MG capsule Take 1 capsule (300 mg total) by mouth 3 (three) times daily. 05/03/24   Lenard Calin, MD  hydrOXYzine  (ATARAX ) 25 MG tablet Take 1 tablet (25 mg total) by mouth 3 (three) times daily as needed. 05/06/24   Tex Drilling, NP  sertraline  (ZOLOFT ) 25 MG tablet Take 1 tablet (25 mg total) by mouth daily. 05/03/24   Lenard Calin, MD  traZODone  (DESYREL ) 50 MG tablet Take 1 tablet (50 mg total) by mouth at bedtime as needed for sleep. 07/12/24   Teresa Wyline CROME, NP    Allergies: Penicillins    Review of Systems  Constitutional:  Negative for appetite change and fatigue.  HENT:  Negative for congestion, ear discharge and sinus pressure.   Eyes:  Negative for discharge.  Respiratory:  Negative for cough.   Cardiovascular:  Positive for chest pain.  Gastrointestinal:  Positive for abdominal pain. Negative for diarrhea.  Genitourinary:  Negative for  frequency and hematuria.  Musculoskeletal:  Negative for back pain.  Skin:  Negative for rash.  Neurological:  Negative for seizures and headaches.  Psychiatric/Behavioral:  Negative for hallucinations.     Updated Vital Signs BP 115/76   Pulse 64   Temp 98 F (36.7 C) (Oral)   Resp 19   Ht 5' 8 (1.727 m)   Wt 68 kg   SpO2 98%   BMI 22.79 kg/m   Physical Exam Vitals and nursing note reviewed.  Constitutional:      Appearance: He is well-developed.  HENT:     Head: Normocephalic.     Nose: Nose normal.  Eyes:     General: No scleral icterus.    Conjunctiva/sclera: Conjunctivae normal.  Neck:     Thyroid : No thyromegaly.  Cardiovascular:     Rate and Rhythm: Normal rate and regular rhythm.     Heart sounds: No murmur heard.    No friction rub. No gallop.  Pulmonary:     Breath sounds: No stridor. No wheezing or rales.  Chest:     Chest wall: No tenderness.  Abdominal:     General: There is no distension.     Tenderness: There is abdominal tenderness. There is no rebound.  Musculoskeletal:        General: Normal range of motion.     Cervical back: Neck supple.  Lymphadenopathy:     Cervical: No cervical adenopathy.  Skin:  Findings: No erythema or rash.  Neurological:     Mental Status: He is alert and oriented to person, place, and time.     Motor: No abnormal muscle tone.     Coordination: Coordination normal.  Psychiatric:        Behavior: Behavior normal.     (all labs ordered are listed, but only abnormal results are displayed) Labs Reviewed  HEPATIC FUNCTION PANEL - Abnormal; Notable for the following components:      Result Value   Bilirubin, Direct 0.4 (*)    All other components within normal limits  CBC WITH DIFFERENTIAL/PLATELET - Abnormal; Notable for the following components:   WBC 3.9 (*)    RBC 5.92 (*)    All other components within normal limits  BASIC METABOLIC PANEL WITH GFR  LIPASE, BLOOD  TROPONIN T, HIGH SENSITIVITY   TROPONIN T, HIGH SENSITIVITY    EKG: EKG Interpretation Date/Time:  Tuesday September 11 2024 07:33:21 EDT Ventricular Rate:  60 PR Interval:  147 QRS Duration:  86 QT Interval:  391 QTC Calculation: 391 R Axis:   100  Text Interpretation: Sinus rhythm Right axis deviation Confirmed by Suzette Pac [phone]) on 09/11/2024 11:00:44 AM  Radiology: [facility] Chest 2 View Result Date: 09/11/2024 CLINICAL DATA:  Chest Pain EXAM: CHEST - 2 VIEW COMPARISON:  09/08/2021 FINDINGS: No focal airspace consolidation, pleural effusion, or pneumothorax. No cardiomegaly.No acute fracture or destructive lesion. IMPRESSION: No acute cardiopulmonary abnormality. Electronically Signed   By: Rogelia Myers M.D.   On: 09/11/2024 08:35     Procedures   Medications Ordered in the ED  HYDROcodone -acetaminophen  (NORCO/VICODIN) 5-325 MG per tablet 1 tablet (has no administration in time range)  pantoprazole  (PROTONIX ) injection 40 mg (40 mg Intravenous Given 09/11/24 0825)  ketorolac  (TORADOL ) 30 MG/ML injection 30 mg (30 mg Intravenous Given 09/11/24 0825)                                    Medical Decision Making Amount and/or Complexity of Data Reviewed Labs: ordered. Radiology: ordered.  Risk Prescription drug management.  Patient with normal EKG and normal troponins.  Suspect chest and abdominal pain is more GI related.  He will be placed on Protonix  and will follow-up with family doctor     Final diagnoses:  Atypical chest pain    ED Discharge Orders          Ordered    pantoprazole  (PROTONIX ) 20 MG tablet  Daily        09/11/24 1131               Caidon Foti, MD 09/11/24 1133
